# Patient Record
Sex: Female | Born: 1965 | Race: White | Hispanic: No | Marital: Single | Smoking: Never smoker
Health system: Southern US, Community
[De-identification: ages and names within clinical notes are randomized; demographics above are authoritative.]

## PROBLEM LIST (undated history)

## (undated) DIAGNOSIS — J45909 Unspecified asthma, uncomplicated: Secondary | ICD-10-CM

## (undated) DIAGNOSIS — F32A Depression, unspecified: Secondary | ICD-10-CM

## (undated) DIAGNOSIS — R42 Dizziness and giddiness: Secondary | ICD-10-CM

## (undated) DIAGNOSIS — F419 Anxiety disorder, unspecified: Secondary | ICD-10-CM

## (undated) DIAGNOSIS — K219 Gastro-esophageal reflux disease without esophagitis: Secondary | ICD-10-CM

## (undated) DIAGNOSIS — R0600 Dyspnea, unspecified: Secondary | ICD-10-CM

## (undated) DIAGNOSIS — R079 Chest pain, unspecified: Secondary | ICD-10-CM

## (undated) DIAGNOSIS — M1990 Unspecified osteoarthritis, unspecified site: Secondary | ICD-10-CM

## (undated) DIAGNOSIS — R600 Localized edema: Secondary | ICD-10-CM

## (undated) HISTORY — PX: NO PAST SURGERIES: SHX2092

---

## 2004-05-21 ENCOUNTER — Other Ambulatory Visit: Payer: Self-pay

## 2004-05-21 ENCOUNTER — Emergency Department: Payer: Self-pay | Admitting: Unknown Physician Specialty

## 2004-08-05 ENCOUNTER — Emergency Department: Payer: Self-pay | Admitting: Internal Medicine

## 2007-02-12 ENCOUNTER — Emergency Department: Payer: Self-pay | Admitting: Internal Medicine

## 2007-02-12 ENCOUNTER — Other Ambulatory Visit: Payer: Self-pay

## 2008-04-23 IMAGING — CR DG CHEST 2V
1 series · 2 of 2 positions shown · non-contrast
Comparison: none

REASON FOR EXAM: near syncope
COMMENTS:

PROCEDURE:     DXR - DXR CHEST PA (OR AP) AND LATERAL  - February 12, 2007  [DATE]
RESULT:     The lungs are mildly hyperinflated. There is no focal
infiltrate. The heart and pulmonary vascularity are within the limits of
normal. There is no pleural effusion. The bony structures are normal.

[Series 1: view not recorded · 0.17mm/px · 2 of 2 slices shown]
[im 1/2]
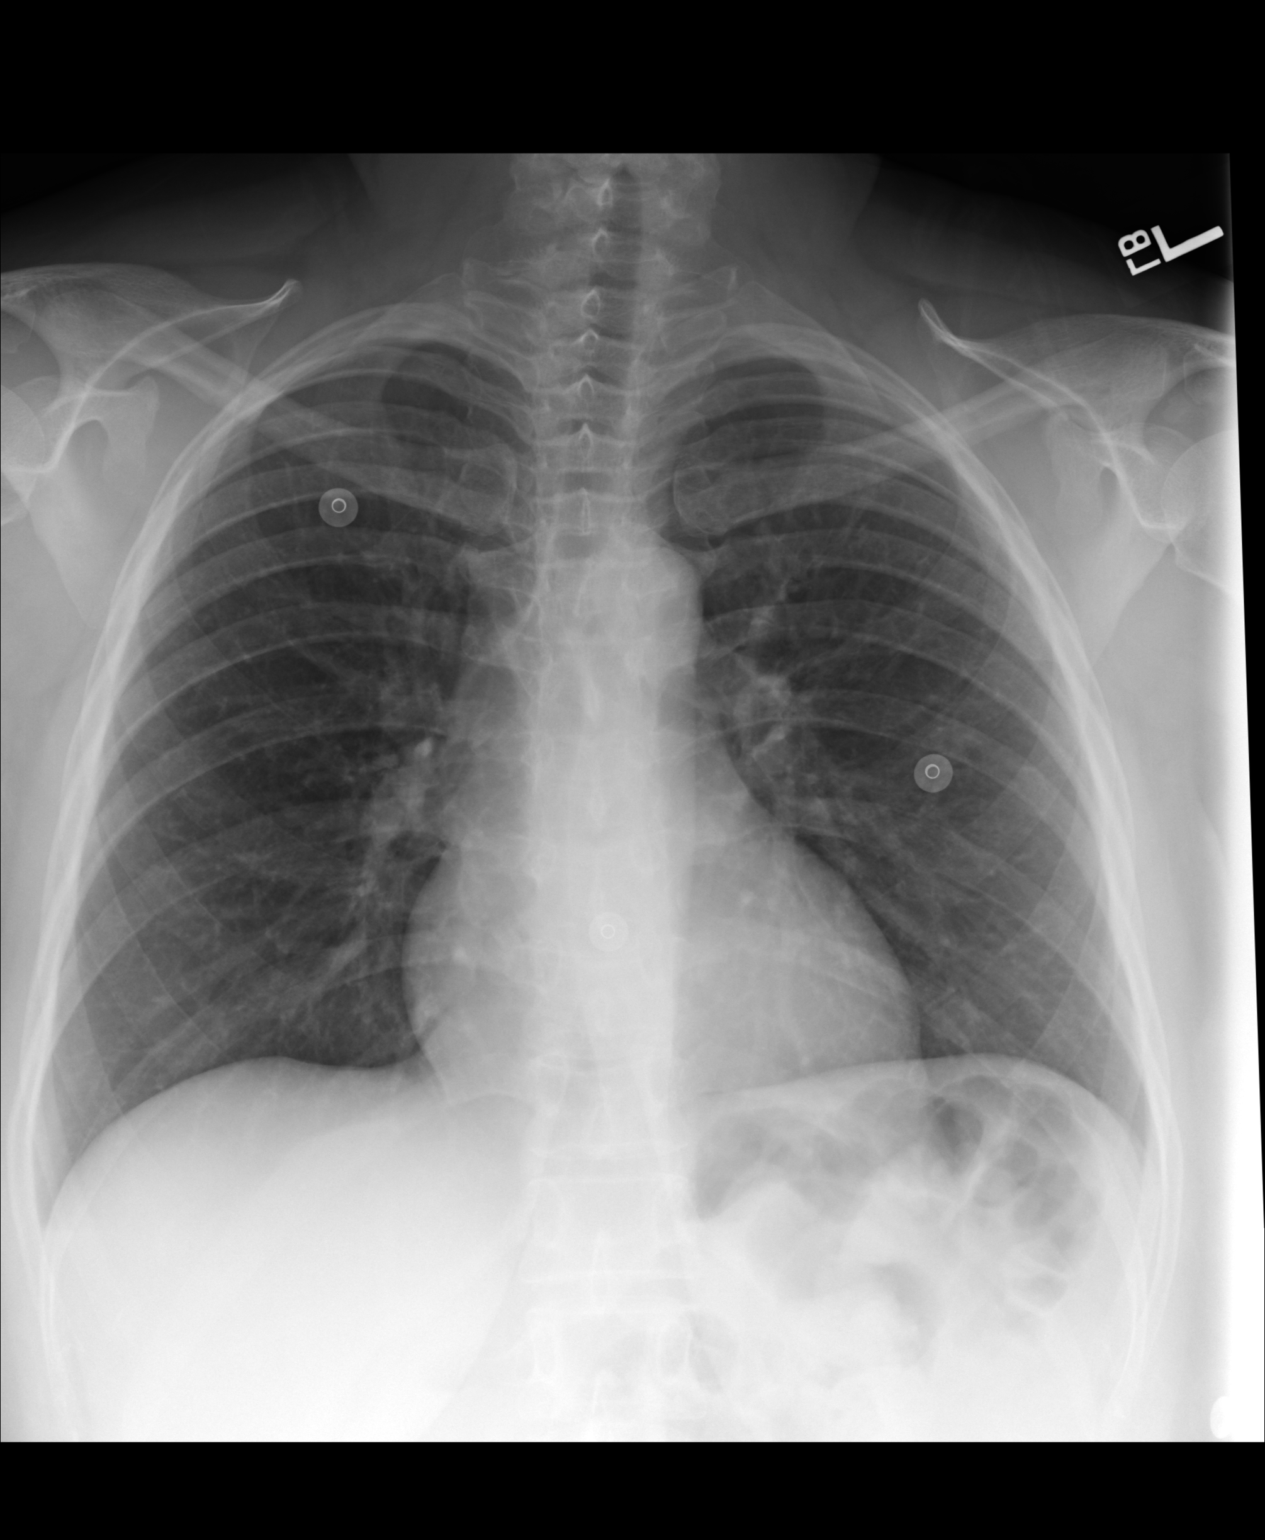
[im 2/2]
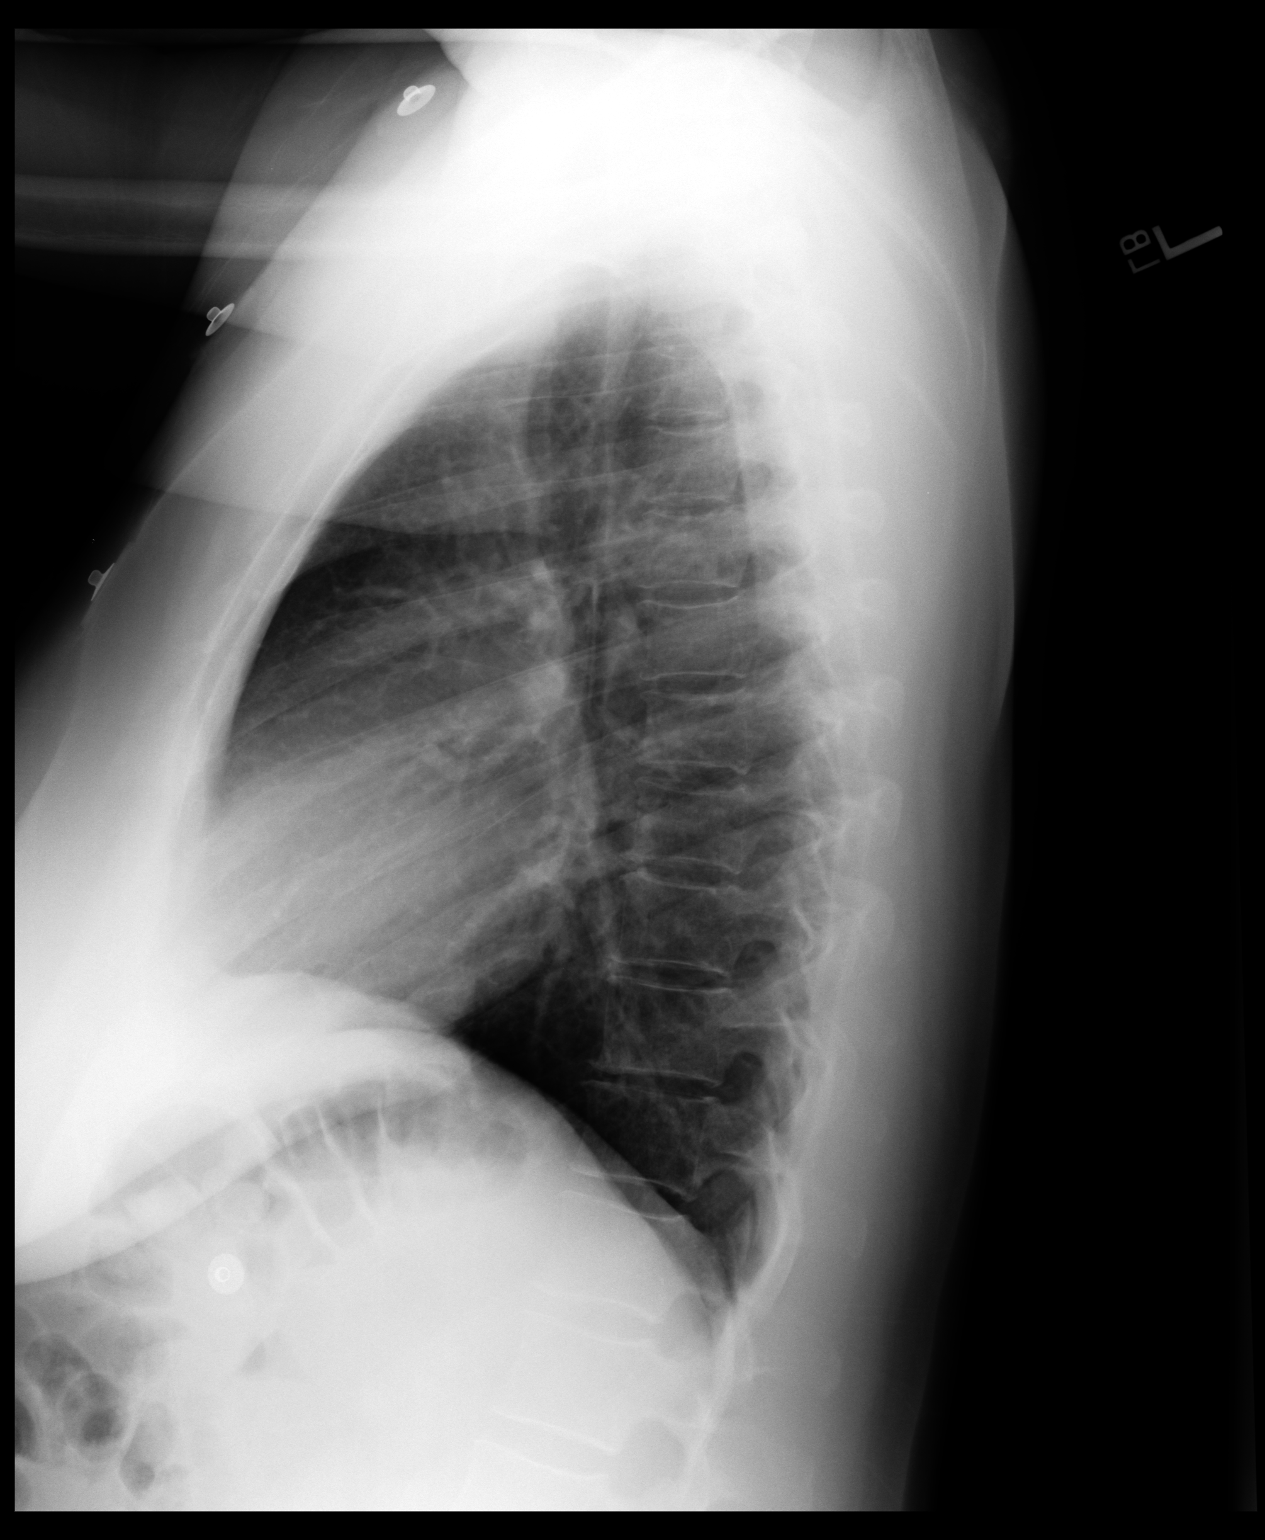

[2 of 2 positions shown; findings below may reference images not displayed]

IMPRESSION: I do not see evidence of acute cardiopulmonary abnormality.
Mild hyperinflation is likely voluntary but could reflect mild air trapping
in the appropriate clinical setting.

## 2009-08-22 ENCOUNTER — Emergency Department: Payer: Self-pay | Admitting: Internal Medicine

## 2009-10-06 ENCOUNTER — Emergency Department: Payer: Self-pay | Admitting: Emergency Medicine

## 2017-11-27 ENCOUNTER — Other Ambulatory Visit: Payer: Self-pay

## 2017-11-27 ENCOUNTER — Encounter: Payer: Self-pay | Admitting: Emergency Medicine

## 2017-11-27 ENCOUNTER — Ambulatory Visit
Admission: EM | Admit: 2017-11-27 | Discharge: 2017-11-27 | Disposition: A | Discharge status: Home / Self Care | Payer: BLUE CROSS/BLUE SHIELD | Attending: Internal Medicine | Admitting: Internal Medicine

## 2017-11-27 DIAGNOSIS — J019 Acute sinusitis, unspecified: Secondary | ICD-10-CM

## 2017-11-27 MED ORDER — AMOXICILLIN-POT CLAVULANATE 875-125 MG PO TABS: 1.0000 | ORAL_TABLET | Freq: Two times a day (BID) | ORAL | 0 refills | Status: DC

## 2017-11-27 MED ORDER — TRIAMCINOLONE ACETONIDE 55 MCG/ACT NA AERO: 2.0000 | INHALATION_SPRAY | Freq: Every day | NASAL | 0 refills | Status: DC

## 2017-11-27 NOTE — Discharge Instructions (Addendum)
Anticipate continued improvement in sinus congestion, imbalance, jitterniness, fatigue, over the next week or two.  Prescriptions for amoxicillin/clavulanate (antibiotic) and nasal steroid spray (for congestion) were sent to the pharmacy.  Review of your [facility] chart shows a prescription for amoxicillin was given in 06/2016, without ill effect, so I felt comfortable prescribing amoxicillin/clavulanate today.  Recheck for new fever >100.5, increasing phlegm production/nasal discharge, or if not starting to improve in a few days.   Note for work [employer].

## 2017-11-27 NOTE — ED Triage Notes (Signed)
Patient states that she was seen at [facility] walk[facility] on 11/20/17 and was given medication for this.  Patient states that her symptoms have not improved but since taking the medicine she feels off balance.

## 2017-11-27 NOTE — ED Provider Notes (Signed)
[facility]    CSN: [id number] Arrival date & time: 11/27/17  1346     History   Chief Complaint Chief Complaint  Patient presents with  . Sinus Problem    HPI Thereasa ParkinBetty L Carrillo is a [age] y.o. Carrillo.   She has a history of frequent sinusitis, sometimes prolonged episodes.  She presents today with a couple month history of headache, profuse nasal drainage, sinus congestion, productive cough.  She was seen at [facility] a week ago, received prescriptions for prednisone and doxycycline.  She has had a marked reduction in nasal discharge and productive cough, but still feels she has some head congestion, feels off balance, jittery, some fatigue.  Headache has resolved.  No fever.  Little bit of crampy abdominal discomfort last night, and one loose stool.  Would like a work note.    HPI  History reviewed. No pertinent past medical history.  History reviewed. No pertinent surgical history.   Home Medications    Prior to Admission medications   Medication Sig Start Date End Date Taking? Authorizing Provider  azelastine (ASTELIN) 0.1 % nasal spray Place into [facility] nose. 11/20/17  Yes [provider]  fluticasone (FLONASE) 50 MCG/ACT nasal spray Place into [facility] nose. 04/25/17  Yes [provider]  amoxicillin-clavulanate (AUGMENTIN) 875-125 MG tablet Take 1 tablet by mouth every 12 (twelve) hours. 11/27/17   [facility]andra Carrillo, Cassandra Mcbee Wilson, MD  triamcinolone (NASACORT) 55 MCG/ACT AERO nasal inhaler Place 2 sprays into [facility] nose daily. 11/27/17   [facility]andra Carrillo, Cassandra Coltrane Wilson, MD    Family History History reviewed. No pertinent family history.  Social History Social History   Tobacco Use  . Smoking status: Never Smoker  . Smokeless tobacco: Never Used  Substance Use Topics  . Alcohol use: Never    Frequency: Never  . Drug use: Never     Allergies   Penicillins Patient reports remote hx childhood rash after antibiotic injection (?penicillin) at dr's  office.  [facility] record review with rx amoxicillin given in 06/2016; patient reports this was tolerated without incident.  Review of Systems Review of Systems  All other systems reviewed and are negative.    Physical Exam Triage Vital Signs ED Triage Vitals  Enc Vitals Group     BP 11/27/17 1408 121/89     Pulse Rate 11/27/17 1408 72     Resp 11/27/17 1408 16     Temp 11/27/17 1408 98.5 F (36.9 C)     Temp Source 11/27/17 1408 Oral     SpO2 11/27/17 1408 97 %     Weight 11/27/17 1404 220 lb (99.8 kg)     Height 11/27/17 1404 5\' 4"  (1.626 m)     Pain Score 11/27/17 1403 2     Pain Loc --    Updated Vital Signs BP 121/89 (BP Location: Left Arm)   Pulse 72   Temp 98.5 F (36.9 C) (Oral)   Resp 16   Ht 5\' 4"  (1.626 m)   Wt 99.8 kg   LMP 11/25/2017 (Approximate)   SpO2 97%   BMI 37.76 kg/m  Physical Exam  Constitutional: She is oriented to person, place, and time. No distress.  HENT:  Head: Atraumatic.  Bilateral TMs are dull, right is red-tinged Significant, almost total, nasal congestion bilaterally Tonsils are prominent but pink, with scant postnasal discharge evident  Eyes:  Conjugate gaze observed, no eye redness/discharge  Neck: Neck supple.  Cardiovascular: Normal rate and regular rhythm.  Pulmonary/Chest: No respiratory  distress. She has no wheezes. She has no rales.  Lungs clear, symmetric breath sounds   Abdominal: She exhibits no distension.  Musculoskeletal: Normal range of motion.  Neurological: She is alert and oriented to person, place, and time.  [facility]ed into [facility] independently, was able to climb on/off [facility] exam table  Skin: Skin is warm and dry.  Nursing note and vitals reviewed.  EMR/care everywhere reviewed.   Final Clinical Impressions(s) / UC Diagnoses   Final diagnoses:  Acute sinusitis with symptoms > 10 days     Discharge Instructions     Anticipate continued improvement in sinus congestion, imbalance, jitterniness,  fatigue, over [facility] next week or two.  Prescriptions for amoxicillin/clavulanate (antibiotic) and nasal steroid spray (for congestion) were sent to [facility].  Review of your [facility] chart shows a prescription for amoxicillin was given in 06/2016, without ill effect, so I felt comfortable prescribing amoxicillin/clavulanate today.  Recheck for new fever >100.5, increasing phlegm production/nasal discharge, or if not starting to improve in a few days.   Note for work [employer].   ED Prescriptions    Medication Sig Dispense Auth. Provider   amoxicillin-clavulanate (AUGMENTIN) 875-125 MG tablet Take 1 tablet by mouth every 12 (twelve) hours. 14 tablet [facility] Carrillo, Westley Blass Wilson, MD   triamcinolone (NASACORT) 55 MCG/ACT AERO nasal inhaler Place 2 sprays into [facility] nose daily. 1 Inhaler [facility] Carrillo, Mardell Cragg Wilson, MD        [facility] Carrillo, Raeley Gilmore Wilson, MD 11/27/17 [phone]

## 2017-12-04 ENCOUNTER — Other Ambulatory Visit: Payer: Self-pay

## 2017-12-04 ENCOUNTER — Ambulatory Visit
Admission: EM | Admit: 2017-12-04 | Discharge: 2017-12-04 | Disposition: A | Discharge status: Home / Self Care | Payer: BLUE CROSS/BLUE SHIELD | Attending: Family Medicine | Admitting: Family Medicine

## 2017-12-04 DIAGNOSIS — J0101 Acute recurrent maxillary sinusitis: Secondary | ICD-10-CM | POA: Diagnosis not present

## 2017-12-04 DIAGNOSIS — H6983 Other specified disorders of Eustachian tube, bilateral: Secondary | ICD-10-CM | POA: Diagnosis not present

## 2017-12-04 MED ORDER — LORATADINE 10 MG PO TABS: 10.0000 mg | ORAL_TABLET | Freq: Every day | ORAL | 0 refills | Status: DC

## 2017-12-04 MED ORDER — AMOXICILLIN-POT CLAVULANATE 875-125 MG PO TABS: 1.0000 | ORAL_TABLET | Freq: Two times a day (BID) | ORAL | 0 refills | Status: AC

## 2017-12-04 NOTE — Discharge Instructions (Addendum)
Take medication as prescribed. Rest. Drink plenty of fluids.   Follow-up with ear nose and throat this week.  See the above to call to schedule appointment today.  Follow up with your primary care physician this week as needed. Return to Urgent care for new or worsening concerns.

## 2017-12-04 NOTE — ED Triage Notes (Signed)
Patient complains of sinus pain and pressure x 11/20/2017. Patient states that she has been on Augmentin and nasacort without relief. Patient states that she has not noticed any improvement.

## 2017-12-04 NOTE — ED Provider Notes (Signed)
[facility] ____________________________________________  Time seen: Approximately 5:00 PM  I have reviewed the triage vital signs and the nursing notes.   HISTORY  Chief Complaint Sinusitis  HPI Cassandra Carrillo is a 52 y.o. female presenting with husband at bedside for evaluation of continued nasal congestion, sinus drainage and fluid sensation in her ears.  States that overall this is been going on for 2 to 3 weeks.  Reports she was seen initially on 8/12 and prescribed doxycycline, prednisone and Tessalon.  States that she continue these medications but was seen last Monday for continued complaints and at which point she was put on a 7-day course of Augmentin.  States that she has 1 dose left of this medicine.  States that she currently feels much better than she did last Monday, but reports she still has nasal congestion.  States that she still gets some clear nasal drainage out, and thicker postnasal drainage.  States her biggest complaint is that both of her ears feel like there is fluid sensation and some pressure beneath her ears.  States minimal sinus pain continued, states that that she is much improved.  Denies sore throat, oral swelling or difficulty swallowing.  No accompanying fevers.  Does work with patients and frequently around sick people.  Has been using the Augmentin and Flonase.  States that she believes she has some seasonal allergies as she has intermittent nasal congestion since May.  No over-the-counter medications taken for the same complaints. Denies chest pain, shortness of breath, syncope, near-syncope, unsteady gait, fall, paresthesias, abdominal pain, dysuria, or rash.    History reviewed. No pertinent past medical history. Denies  There are no active problems to display for this patient.   History reviewed. No pertinent surgical history.   No current facility-administered medications for this encounter.   Current Outpatient Medications:  .   azelastine (ASTELIN) 0.1 % nasal spray, Place into the nose., Disp: , Rfl:  .  fluticasone (FLONASE) 50 MCG/ACT nasal spray, Place into the nose., Disp: , Rfl:  .  triamcinolone (NASACORT) 55 MCG/ACT AERO nasal inhaler, Place 2 sprays into the nose daily., Disp: 1 Inhaler, Rfl: 0 .  amoxicillin-clavulanate (AUGMENTIN) 875-125 MG tablet, Take 1 tablet by mouth every 12 (twelve) hours for 3 days., Disp: 6 tablet, Rfl: 0 .  loratadine (CLARITIN) 10 MG tablet, Take 1 tablet (10 mg total) by mouth daily., Disp: 30 tablet, Rfl: 0  Allergies Penicillins  Family History  Problem Relation Age of Onset  . Kidney disease Mother   . Heart disease Father     Social History Social History   Tobacco Use  . Smoking status: Never Smoker  . Smokeless tobacco: Never Used  Substance Use Topics  . Alcohol use: Never    Frequency: Never  . Drug use: Never    Review of Systems Constitutional: No fever/chills Eyes: No visual changes. ENT: No sore throat. As above.  Cardiovascular: Denies chest pain. Respiratory: Denies shortness of breath. Gastrointestinal: No abdominal pain.  No nausea, no vomiting.  No diarrhea.  No constipation. Genitourinary: Negative for dysuria. Musculoskeletal: Negative for back pain. Skin: Negative for rash. Neurological: Negative for headaches, focal weakness or numbness.   ____________________________________________   PHYSICAL EXAM:  VITAL SIGNS: ED Triage Vitals  Enc Vitals Group     BP 12/04/17 1557 118/77     Pulse Rate 12/04/17 1557 73     Resp 12/04/17 1557 18     Temp 12/04/17 1557 98.2 F (36.8 C)  Temp Source 12/04/17 1557 Oral     SpO2 12/04/17 1557 99 %     Weight 12/04/17 1555 220 lb (99.8 kg)     Height 12/04/17 1555 5\' 4"  (1.626 m)     Head Circumference --      Peak Flow --      Pain Score 12/04/17 1554 2     Pain Loc --      Pain Edu? --      Excl. in GC? --     Constitutional: Alert and oriented. Well appearing and in no acute  distress. Eyes: Conjunctivae are normal. PERRL. EOMI. Head: Atraumatic.Mild tenderness to palpation bilateral maxillary sinuses.  No frontal tenderness palpation.  No swelling. No erythema.   Ears: no erythema, normal canals, mild bilateral effusion, otherwise normal TMs bilaterally.  No mastoid tenderness bilaterally.  Nose: nasal congestion  Mouth/Throat: Mucous membranes are moist.  Oropharynx non-erythematous.No tonsillar swelling or exudate.  Neck: No stridor.  No cervical spine tenderness to palpation. Hematological/Lymphatic/Immunilogical: No cervical lymphadenopathy. Cardiovascular: Normal rate, regular rhythm. Grossly normal heart sounds.  Good peripheral circulation. Respiratory: Normal respiratory effort.  No retractions.No wheezes, rales or rhonchi. Good air movement.  Musculoskeletal:  No cervical, thoracic or lumbar tenderness to palpation.  Steady gait. Neurologic:  Normal speech and language. No gross focal neurologic deficits are appreciated. No gait instability.  No paresthesias.  No ataxia. Skin:  Skin is warm, dry and intact. No rash noted. Psychiatric: Mood and affect are normal. Speech and behavior are normal.   ___________________________________________   LABS (all labs ordered are listed, but only abnormal results are displayed)  Labs Reviewed - No data to display ____________________________________________   PROCEDURES Procedures   INITIAL IMPRESSION / ASSESSMENT AND PLAN / ED COURSE  Pertinent labs & imaging results that were available during my care of the patient were reviewed by me and considered in my medical decision making (see chart for details).  Well-appearing patient.  No acute distress.  Patient has been on 2 courses of antibiotics in the last 2 weeks, currently on a 7-day course of Augmentin.  Continues with nasal drainage and fluid sensation in her ears.  Steady gait.  No focal neurological deficit.  Patient states that she has been improving  with the Augmentin.  Suspect eustachian tube dysfunction.  Also possible seasonal allergies with recent secondary infection versus viral.  Will extend Augmentin for 3 more days, add oral Claritin and recommend for ENT follow-up towards the end of this week, information given and directed patient to call.  Discussed strict follow-up and sooner return parameters. Discussed indication, risks and benefits of medications with patient.  Discussed follow up with Primary care physician this week. Discussed follow up and return parameters including no resolution or any worsening concerns. Patient verbalized understanding and agreed to plan.   ____________________________________________   FINAL CLINICAL IMPRESSION(S) / ED DIAGNOSES  Final diagnoses:  Dysfunction of both eustachian tubes  Acute recurrent maxillary sinusitis     ED Discharge Orders         Ordered    amoxicillin-clavulanate (AUGMENTIN) 875-125 MG tablet  Every 12 hours     12/04/17 1657    loratadine (CLARITIN) 10 MG tablet  Daily     12/04/17 1657           Note: This dictation was prepared with Dragon dictation along with smaller phrase technology. Any transcriptional errors that result from this process are unintentional.         Hyacinth Meeker,  Mardella LaymanLindsey, NP 12/04/17 1710

## 2018-06-07 ENCOUNTER — Other Ambulatory Visit: Payer: Self-pay

## 2018-06-07 ENCOUNTER — Encounter: Payer: Self-pay | Admitting: Emergency Medicine

## 2018-06-07 ENCOUNTER — Ambulatory Visit
Admission: EM | Admit: 2018-06-07 | Discharge: 2018-06-07 | Disposition: A | Discharge status: Home / Self Care | Payer: BLUE CROSS/BLUE SHIELD | Attending: Family Medicine | Admitting: Family Medicine

## 2018-06-07 DIAGNOSIS — J320 Chronic maxillary sinusitis: Secondary | ICD-10-CM

## 2018-06-07 MED ORDER — DOXYCYCLINE HYCLATE 100 MG PO CAPS: 100.0000 mg | ORAL_CAPSULE | Freq: Two times a day (BID) | ORAL | 0 refills | Status: DC

## 2018-06-07 NOTE — Discharge Instructions (Addendum)
Continue taking Flonase on a daily basis for the next 2 to 3 weeks.  Consider using a Nettie pot daily before using Flonase.  Follow-up with Dr. Genevive Bi for evaluation of your recurrent chronic sinusitis

## 2018-06-07 NOTE — ED Triage Notes (Signed)
Pt c/o facial pain and swelling, nasal congestion, post nasal drainage, and cough. Started about 3-4 days ago. She states her head feels full.

## 2018-06-07 NOTE — ED Provider Notes (Signed)
[facility]    CSN: [id number] Arrival date & time: 06/07/18  1342     History   Chief Complaint Chief Complaint  Patient presents with  . Facial Pain    HPI [facility] Carrillo is a 53 y.o. female.   HPI  53 year old female presents with facial pain and swelling over the maxillary sinuses along with nasal congestion postnasal drainage and a cough that started about 3 to 4 days ago.  States that actually started several weeks ago with drainage that she has been having causing her to cough.  States that her head feels full and she has pain in her ears along the jawline inferiorly.  She has had no fever or chills.  Review of her medical records show that she has had recurrent sinusitis problems treated with antibiotics.  Despite this that she has recurrent symptoms she states it is been a while since she has been examined by an ear nose and throat specialist.  In August 2019 she had 3 visits for the same problem.  Prior to that in January 2019 and October 2018 she had been treated for acute sinusitis.       History reviewed. No pertinent past medical history.  There are no active problems to display for this patient.   Past Surgical History:  Procedure Laterality Date  . NO PAST SURGERIES      OB History   No obstetric history on file.      Home Medications    Prior to Admission medications   Medication Sig Start Date End Date Taking? Authorizing Provider  fluticasone (FLONASE) 50 MCG/ACT nasal spray Place into the nose. 04/25/17  Yes [provider]  loratadine (CLARITIN) 10 MG tablet Take 1 tablet (10 mg total) by mouth daily. 12/04/17  Yes Renford Dills, NP  doxycycline (VIBRAMYCIN) 100 MG capsule Take 1 capsule (100 mg total) by mouth 2 (two) times daily. 06/07/18   Lutricia Feil, PA-C    Family History Family History  Problem Relation Age of Onset  . Kidney disease Mother   . Heart disease Father     Social History Social History    Tobacco Use  . Smoking status: Never Smoker  . Smokeless tobacco: Never Used  Substance Use Topics  . Alcohol use: Never    Frequency: Never  . Drug use: Never     Allergies   Penicillins   Review of Systems Review of Systems  Constitutional: Positive for activity change and chills. Negative for appetite change, diaphoresis, fatigue and fever.  HENT: Positive for congestion, ear pain, facial swelling, postnasal drip, sinus pressure and sinus pain.   Respiratory: Positive for cough.   All other systems reviewed and are negative.    Physical Exam Triage Vital Signs ED Triage Vitals  Enc Vitals Group     BP 06/07/18 1412 126/63     Pulse Rate 06/07/18 1412 65     Resp 06/07/18 1412 18     Temp 06/07/18 1412 98.1 F (36.7 C)     Temp Source 06/07/18 1412 Oral     SpO2 06/07/18 1412 98 %     Weight 06/07/18 1408 220 lb (99.8 kg)     Height 06/07/18 1408  (1.626 m)     Head Circumference --      Peak Flow --      Pain Score 06/07/18 1408 5     Pain Loc --      Pain Edu? --  Excl. in GC? --    No data found.  Updated Vital Signs BP 126/63 (BP Location: Left Arm)   Pulse 65   Temp 98.1 F (36.7 C) (Oral)   Resp 18   Ht 5\' 4"  (1.626 m)   Wt 220 lb (99.8 kg)   LMP 04/06/2018   SpO2 98%   BMI 37.76 kg/m   Visual Acuity Right Eye Distance:   Left Eye Distance:   Bilateral Distance:    Right Eye Near:   Left Eye Near:    Bilateral Near:     Physical Exam Vitals signs and nursing note reviewed.  Constitutional:      General: She is not in acute distress.    Appearance: Normal appearance. She is obese. She is not ill-appearing, toxic-appearing or diaphoretic.  HENT:     Head: Normocephalic and atraumatic.     Comments: TM is dark and dull.  Tenderness to percussion over the sinuses more prevalent on the left than right.    Right Ear: Ear canal and external ear normal.     Left Ear: Ear canal and external ear normal.     Nose: Nose normal. No  rhinorrhea.     Mouth/Throat:     Mouth: Mucous membranes are moist.     Pharynx: Oropharynx is clear. No oropharyngeal exudate or posterior oropharyngeal erythema.  Eyes:     General:        Right eye: No discharge.        Left eye: No discharge.     Conjunctiva/sclera: Conjunctivae normal.  Neck:     Musculoskeletal: Normal range of motion and neck supple.  Pulmonary:     Effort: Pulmonary effort is normal.     Breath sounds: Normal breath sounds.  Musculoskeletal: Normal range of motion.  Lymphadenopathy:     Cervical: No cervical adenopathy.  Skin:    General: Skin is warm and dry.  Neurological:     General: No focal deficit present.     Mental Status: She is alert and oriented to person, place, and time.  Psychiatric:        Mood and Affect: Mood normal.        Behavior: Behavior normal.        Thought Content: Thought content normal.        Judgment: Judgment normal.      UC Treatments / Results  Labs (all labs ordered are listed, but only abnormal results are displayed) Labs Reviewed - No data to display  EKG None  Radiology No results found.  Procedures Procedures (including critical care time)  Medications Ordered in UC Medications - No data to display  Initial Impression / Assessment and Plan / UC Course  I have reviewed the triage vital signs and the nursing notes.  Pertinent labs & imaging results that were available during my care of the patient were reviewed by me and considered in my medical decision making (see chart for details).   Has recurrent sinusitis that should be evaluated further.  The meantime I will place her on doxycycline.  She will continue with her fluticasone.  So discussed using a Nettie pot with her and she seems amenable to it.  Amended Dr. Frederik Pear for her to follow-up.   Final Clinical Impressions(s) / UC Diagnoses   Final diagnoses:  Chronic maxillary sinusitis     Discharge Instructions     Continue taking  Flonase on a daily basis for the next 2 to 3 weeks.  Consider using a Nettie pot daily before using Flonase.  Follow-up with Dr. Genevive Bi for evaluation of your recurrent chronic sinusitis   ED Prescriptions    Medication Sig Dispense Auth. Provider   doxycycline (VIBRAMYCIN) 100 MG capsule Take 1 capsule (100 mg total) by mouth 2 (two) times daily. 14 capsule Lutricia Feil, PA-C     Controlled Substance Prescriptions [place] Controlled Substance Registry consulted? Not Applicable   Lutricia Feil, PA-C 06/07/18 2134

## 2018-06-11 ENCOUNTER — Other Ambulatory Visit: Payer: Self-pay

## 2018-06-11 ENCOUNTER — Encounter: Payer: Self-pay | Admitting: Emergency Medicine

## 2018-06-11 ENCOUNTER — Ambulatory Visit
Admission: EM | Admit: 2018-06-11 | Discharge: 2018-06-11 | Disposition: A | Discharge status: Home / Self Care | Payer: BLUE CROSS/BLUE SHIELD | Attending: Internal Medicine | Admitting: Internal Medicine

## 2018-06-11 DIAGNOSIS — H6982 Other specified disorders of Eustachian tube, left ear: Secondary | ICD-10-CM

## 2018-06-11 DIAGNOSIS — H938X2 Other specified disorders of left ear: Secondary | ICD-10-CM | POA: Diagnosis not present

## 2018-06-11 MED ORDER — GUAIFENESIN ER 600 MG PO TB12: 600.0000 mg | ORAL_TABLET | Freq: Two times a day (BID) | ORAL | Status: AC

## 2018-06-11 NOTE — ED Triage Notes (Signed)
Patient in today with continued left jaw pain and swelling. Patient seen 06/07/18 for same.

## 2018-06-11 NOTE — ED Provider Notes (Signed)
[facility]    CSN: [id number] Arrival date & time: 06/11/18  1030     History   Chief Complaint Chief Complaint  Patient presents with  . Jaw Pain    HPI Cassandra Carrillo is a 53 y.o. female with a history of seasonal allergies and recurrent sinusitis comes back to the urgent care for left ear fullness.  Patient was seen here last week and is currently being treated for sinusitis with doxycycline, fluticasone and Claritin.  Patient is not taking the Claritin regularly.  She complains of fullness in the left ear which is constant.  Denies any buzzing in the ears.  No aggravating or relieving factors.  Symptoms are moderately severe.  She hears a lot of "popping" in the left ear.  No fever or chills.  She is tolerating medications at this time.  HPI  History reviewed. No pertinent past medical history.  There are no active problems to display for this patient.   Past Surgical History:  Procedure Laterality Date  . NO PAST SURGERIES      OB History   No obstetric history on file.      Home Medications    Prior to Admission medications   Medication Sig Start Date End Date Taking? Authorizing Provider  doxycycline (VIBRAMYCIN) 100 MG capsule Take 1 capsule (100 mg total) by mouth 2 (two) times daily. 06/07/18  Yes Lutricia Feil, PA-C  fluticasone (FLONASE) 50 MCG/ACT nasal spray Place into the nose. 04/25/17  Yes [provider]  loratadine (CLARITIN) 10 MG tablet Take 1 tablet (10 mg total) by mouth daily. 12/04/17  Yes Renford Dills, NP  guaiFENesin (MUCINEX) 600 MG 12 hr tablet Take 1 tablet (600 mg total) by mouth 2 (two) times daily for 7 days. 06/11/18 06/18/18  Merrilee Jansky, MD    Family History Family History  Problem Relation Age of Onset  . Kidney disease Mother   . Heart disease Father     Social History Social History   Tobacco Use  . Smoking status: Never Smoker  . Smokeless tobacco: Never Used  Substance Use Topics  .  Alcohol use: Never    Frequency: Never  . Drug use: Never     Allergies   Penicillins   Review of Systems Review of Systems  Constitutional: Negative for activity change, appetite change, chills and fever.  HENT: Positive for congestion and postnasal drip. Negative for ear discharge, ear pain, hearing loss, rhinorrhea, sinus pressure, sinus pain, sore throat, tinnitus and voice change.   Eyes: Positive for itching. Negative for pain and discharge.  Respiratory: Negative for apnea, cough, choking and shortness of breath.   Gastrointestinal: Negative for abdominal distention and abdominal pain.  Musculoskeletal: Negative for arthralgias and back pain.  Skin: Negative for rash and wound.  Allergic/Immunologic: Positive for environmental allergies.  Neurological: Negative for dizziness, weakness, light-headedness and headaches.  Psychiatric/Behavioral: Negative for agitation, confusion and decreased concentration.     Physical Exam Triage Vital Signs ED Triage Vitals  Enc Vitals Group     BP 06/11/18 1058 133/87     Pulse Rate 06/11/18 1058 71     Resp 06/11/18 1058 16     Temp 06/11/18 1058 97.9 F (36.6 C)     Temp Source 06/11/18 1058 Oral     SpO2 06/11/18 1058 98 %     Weight 06/11/18 1057 220 lb (99.8 kg)     Height 06/11/18 1057 5\' 4"  (1.626 m)  Head Circumference --      Peak Flow --      Pain Score 06/11/18 1057 3     Pain Loc --      Pain Edu? --      Excl. in GC? --    No data found.  Updated Vital Signs BP 133/87 (BP Location: Left Arm)   Pulse 71   Temp 97.9 F (36.6 C) (Oral)   Resp 16   Ht 5\' 4"  (1.626 m)   Wt 99.8 kg   SpO2 98%   BMI 37.76 kg/m   Visual Acuity Right Eye Distance:   Left Eye Distance:   Bilateral Distance:    Right Eye Near:   Left Eye Near:    Bilateral Near:     Physical Exam Constitutional:      Appearance: Normal appearance.  HENT:     Right Ear: Tympanic membrane normal. There is no impacted cerumen.     Left  Ear: Tympanic membrane normal. There is no impacted cerumen.     Nose: Nose normal.     Mouth/Throat:     Mouth: Mucous membranes are moist.     Pharynx: No oropharyngeal exudate or posterior oropharyngeal erythema.  Eyes:     General: No scleral icterus.    Conjunctiva/sclera: Conjunctivae normal.  Neck:     Musculoskeletal: No neck rigidity.  Cardiovascular:     Rate and Rhythm: Normal rate and regular rhythm.     Pulses: Normal pulses.     Heart sounds: Normal heart sounds.  Pulmonary:     Effort: Pulmonary effort is normal.     Breath sounds: Normal breath sounds.  Abdominal:     General: Bowel sounds are normal.     Palpations: Abdomen is soft.  Musculoskeletal: Normal range of motion.  Lymphadenopathy:     Cervical: No cervical adenopathy.  Skin:    General: Skin is warm.     Capillary Refill: Capillary refill takes less than 2 seconds.     Coloration: Skin is not jaundiced.     Findings: No bruising.  Neurological:     Mental Status: She is alert.      UC Treatments / Results  Labs (all labs ordered are listed, but only abnormal results are displayed) Labs Reviewed - No data to display  EKG None  Radiology No results found.  Procedures Procedures (including critical care time)  Medications Ordered in UC Medications - No data to display  Initial Impression / Assessment and Plan / UC Course  I have reviewed the triage vital signs and the nursing notes.  Pertinent labs & imaging results that were available during my care of the patient were reviewed by me and considered in my medical decision making (see chart for details).     1.  Eustachian tube dysfunction: Mucinex Continue Claritin  2.  Recurrent sinusitis currently being treated: Continue fluticasone Complete doxycycline course  Final Clinical Impressions(s) / UC Diagnoses   Final diagnoses:  Ear fullness, left  Dysfunction of left eustachian tube   Discharge Instructions   None     ED Prescriptions    Medication Sig Dispense Auth. Provider   guaiFENesin (MUCINEX) 600 MG 12 hr tablet Take 1 tablet (600 mg total) by mouth 2 (two) times daily for 7 days.  Merrilee Jansky, MD     Controlled Substance Prescriptions [place] Controlled Substance Registry consulted? No   Merrilee Jansky, MD 06/11/18 [phone]

## 2018-06-18 ENCOUNTER — Encounter: Payer: Self-pay | Admitting: Emergency Medicine

## 2018-06-18 ENCOUNTER — Ambulatory Visit
Admission: EM | Admit: 2018-06-18 | Discharge: 2018-06-18 | Disposition: A | Discharge status: Home / Self Care | Payer: BLUE CROSS/BLUE SHIELD | Attending: Family Medicine | Admitting: Family Medicine

## 2018-06-18 ENCOUNTER — Other Ambulatory Visit: Payer: Self-pay

## 2018-06-18 DIAGNOSIS — R059 Cough, unspecified: Secondary | ICD-10-CM

## 2018-06-18 DIAGNOSIS — R05 Cough: Secondary | ICD-10-CM

## 2018-06-18 DIAGNOSIS — H6592 Unspecified nonsuppurative otitis media, left ear: Secondary | ICD-10-CM

## 2018-06-18 DIAGNOSIS — J0101 Acute recurrent maxillary sinusitis: Secondary | ICD-10-CM | POA: Diagnosis not present

## 2018-06-18 MED ORDER — HYDROCOD POLST-CPM POLST ER 10-8 MG/5ML PO SUER: 5.0000 mL | Freq: Every evening | ORAL | 0 refills | Status: DC | PRN

## 2018-06-18 MED ORDER — AMOXICILLIN-POT CLAVULANATE 875-125 MG PO TABS: 1.0000 | ORAL_TABLET | Freq: Two times a day (BID) | ORAL | 0 refills | Status: DC

## 2018-06-18 NOTE — Discharge Instructions (Addendum)
Take medication as prescribed. Rest. Drink plenty of fluids.  Continue the Mucinex and Flonase.  Follow-up with ear nose and throat as planned.  Follow up with your primary care physician this week as needed. Return to Urgent care for new or worsening concerns.

## 2018-06-18 NOTE — ED Triage Notes (Signed)
Patient c/o cough, wheezing and ear fullness that started 3 weeks ago. Patient was given Doxycycline at the 1st visit on 02/27 and she stated this hasn't helped. Patient then was seen on 03/02 for the same symptoms and given Guafenisin. She stated this didn't help her symptoms, She is requesting Augmentin 875, Prednisone and a nasal spray.

## 2018-06-18 NOTE — ED Provider Notes (Addendum)
[facility] ____________________________________________  Time seen: Approximately 5:18 PM  I have reviewed the triage vital signs and the nursing notes.   HISTORY  Chief Complaint Cough and Ear Fullness   HPI Cassandra Carrillo is a 53 y.o. female past medical history of seasonal allergies and recurrent sinusitis, presenting for evaluation of cough, congestion, nasal drainage, sinus pressure and ear fullness present for the last several weeks.  Patient reports that she was initially seen in urgent care on 2/27, and then on 3/2.  States that she was treated for sinusitis with oral doxycycline and then was given Mucinex.  States that she has been taken the Mucinex as well as completed the doxycycline without resolution.  States medicines do help, but reports she is continued with the congestion and thick green nasal discharge.  States intermittent cough, cough worse at night with postnasal drainage.  States last few days she has been having increased left ear discomfort and pressure sensation.  Denies known fevers.  Does have Flonase at home and has been intermittently using.  Continues to overall eat and drink well.  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  Denies chest pain, shortness of breath or abdominal pain.      History reviewed. No pertinent past medical history. Current sinusitis. There are no active problems to display for this patient.   Past Surgical History:  Procedure Laterality Date  . NO PAST SURGERIES       No current facility-administered medications for this encounter.   Current Outpatient Medications:  .  fluticasone (FLONASE) 50 MCG/ACT nasal spray, Place into the nose., Disp: , Rfl:  .  loratadine (CLARITIN) 10 MG tablet, Take 1 tablet (10 mg total) by mouth daily., Disp: 30 tablet, Rfl: 0 .  amoxicillin-clavulanate (AUGMENTIN) 875-125 MG tablet, Take 1 tablet by mouth every 12 (twelve) hours., Disp: 20  tablet, Rfl: 0 .  chlorpheniramine-HYDROcodone (TUSSIONEX PENNKINETIC ER) 10-8 MG/5ML SUER, Take 5 mLs by mouth at bedtime as needed for cough. do not drive or operate machinery while taking as can cause drowsiness., Disp: 50 mL, Rfl: 0 .  guaiFENesin (MUCINEX) 600 MG 12 hr tablet, Take 1 tablet (600 mg total) by mouth 2 (two) times daily for 7 days., Disp: , Rfl:   Allergies Penicillins  Family History  Problem Relation Age of Onset  . Kidney disease Mother   . Heart disease Father     Social History Social History   Tobacco Use  . Smoking status: Never Smoker  . Smokeless tobacco: Never Used  Substance Use Topics  . Alcohol use: Never    Frequency: Never  . Drug use: Never    Review of Systems Constitutional: No fever ENT: Some throat irritation.  Positive nasal congestion. Cardiovascular: Denies chest pain. Respiratory: Denies shortness of breath. Gastrointestinal: No abdominal pain.   Musculoskeletal: Negative for back pain. Skin: Negative for rash.   ____________________________________________   PHYSICAL EXAM:  VITAL SIGNS: ED Triage Vitals  Enc Vitals Group     BP 06/18/18 1617 129/82     Pulse Rate 06/18/18 1617 73     Resp 06/18/18 1617 18     Temp 06/18/18 1617 98.3 F (36.8 C)     Temp Source 06/18/18 1617 Oral     SpO2 06/18/18 1617 98 %     Weight 06/18/18 1615 220 lb (99.8 kg)     Height 06/18/18 1615 5\' 4"  (1.626 m)     Head  Circumference --      Peak Flow --      Pain Score 06/18/18 1615 0     Pain Loc --      Pain Edu? --      Excl. in GC? --     Constitutional: Alert and oriented. Well appearing and in no acute distress. Eyes: Conjunctivae are normal.  Head: Atraumatic.Mild tenderness to palpation bilateral frontal and moderate tenderness to palpation bilateral maxillary sinuses. No swelling. No erythema.   Ears: Left: Nontender, normal canal, moderate erythema with effusion present.  Right: Nontender, normal canal, no erythema, normal  TM.  Nose: nasal congestion with bilateral nasal turbinate erythema and edema.   Mouth/Throat: Mucous membranes are moist.  Oropharynx non-erythematous.No tonsillar swelling or exudate.  Neck: No stridor.  No cervical spine tenderness to palpation. Hematological/Lymphatic/Immunilogical: No cervical lymphadenopathy. Cardiovascular: Normal rate, regular rhythm. Grossly normal heart sounds.  Good peripheral circulation. Respiratory: Normal respiratory effort.  No retractions. No wheezes, rales or rhonchi. Good air movement.  Musculoskeletal: Steady gait. Neurologic:  Normal speech and language. No gait instability. Skin:  Skin is warm, dry and intact. No rash noted. Psychiatric: Mood and affect are normal. Speech and behavior are normal. _______________________________________   LABS (all labs ordered are listed, but only abnormal results are displayed)  Labs Reviewed - No data to display  PROCEDURES Procedures    INITIAL IMPRESSION / ASSESSMENT AND PLAN / ED COURSE  Pertinent labs & imaging results that were available during my care of the patient were reviewed by me and considered in my medical decision making (see chart for details).  Well-appearing patient.  No acute distress.  Suspect recent viral illness versus allergic rhinitis with secondary sinusitis.  Suspect failed doxycycline.  Patient states that she tolerates Augmentin well and not allergic.  Will treat with oral Augmentin continue Mucinex and Flonase.  Left otitis with effusion, continue supportive care as well as the Augmentin.  Recommend ENT follow-up, patient states that she has an appointment with ENT at the end of March.  Work note given.  PRN Tussionex at night.Discussed indication, risks and benefits of medications with patient.  Discussed follow up with Primary care physician this week. Discussed follow up and return parameters including no resolution or any worsening concerns. Patient verbalized understanding and  agreed to plan.   ____________________________________________   FINAL CLINICAL IMPRESSION(S) / ED DIAGNOSES  Final diagnoses:  Acute recurrent maxillary sinusitis  Left otitis media with effusion  Cough     ED Discharge Orders         Ordered    amoxicillin-clavulanate (AUGMENTIN) 875-125 MG tablet  Every 12 hours     06/18/18 1717    chlorpheniramine-HYDROcodone (TUSSIONEX PENNKINETIC ER) 10-8 MG/5ML SUER  At bedtime PRN     06/18/18 1717           Note: This dictation was prepared with Dragon dictation along with smaller phrase technology. Any transcriptional errors that result from this process are unintentional.         Renford Dills, NP 06/18/18 1958

## 2018-12-13 ENCOUNTER — Ambulatory Visit
Admission: EM | Admit: 2018-12-13 | Discharge: 2018-12-13 | Disposition: A | Discharge status: Home / Self Care | Payer: BLUE CROSS/BLUE SHIELD | Attending: Family Medicine | Admitting: Family Medicine

## 2018-12-13 ENCOUNTER — Other Ambulatory Visit: Payer: Self-pay

## 2018-12-13 DIAGNOSIS — K120 Recurrent oral aphthae: Secondary | ICD-10-CM

## 2018-12-13 MED ORDER — SULFURIC ACID-SULF PHENOLICS 30-50 % MT SOLN: OROMUCOSAL | 3 refills | Status: DC

## 2018-12-13 MED ORDER — LIDOCAINE VISCOUS HCL 2 % MT SOLN: OROMUCOSAL | 0 refills | Status: DC

## 2018-12-13 NOTE — ED Triage Notes (Signed)
Patient complains of canker sores on her left side of her tongue that started Monday PM.

## 2018-12-13 NOTE — ED Provider Notes (Signed)
[facility]    CSN: [id number] Arrival date & time: 12/13/18  1441  History   Chief Complaint Chief Complaint  Patient presents with  . Mouth Lesions   HPI  53 year old female presents with a lesion on her tongue.  Patient reports that she has developed a canker sore on the left side of her tongue which started on Monday.  She reports it is quite painful.  She rates her pain is 10/10 in severity.  Worse with eating and drinking.  She has tried salt water gargles without resolution.  No relieving factors.  She is unsure of the inciting factor.  No reported fever.  No other associated symptoms.  No other complaints.  PMH, Surgical Hx, Family Hx, Social History reviewed and updated as below.  PMH: Chronic sinusitis  Past Surgical History:  Procedure Laterality Date  . NO PAST SURGERIES      OB History   No obstetric history on file.      Home Medications    Prior to Admission medications   Medication Sig Start Date End Date Taking? Authorizing Provider  fluticasone (FLONASE) 50 MCG/ACT nasal spray Place into the nose. 04/25/17  Yes [provider]  loratadine (CLARITIN) 10 MG tablet Take 1 tablet (10 mg total) by mouth daily. 12/04/17  Yes Marylene Land, NP  lidocaine (XYLOCAINE) 2 % solution Apply small amount directly to canker sore every 3 hours as needed. 12/13/18   Coral Spikes, DO  Sulfuric Acid-Sulf Phenolics 32-67 % SOLN Apply to affected area per package instructions. Please provide 2 packages/treatments 12/13/18   Coral Spikes, DO    Family History Family History  Problem Relation Age of Onset  . Kidney disease Mother   . Heart disease Father     Social History Social History   Tobacco Use  . Smoking status: Never Smoker  . Smokeless tobacco: Never Used  Substance Use Topics  . Alcohol use: Never    Frequency: Never  . Drug use: Never     Allergies   Penicillins   Review of Systems Review of Systems  Constitutional:  Negative.   HENT:       Canker sore.   Physical Exam Triage Vital Signs ED Triage Vitals  Enc Vitals Group     BP 12/13/18 1458 125/65     Pulse Rate 12/13/18 1458 80     Resp 12/13/18 1458 18     Temp 12/13/18 1458 98.5 F (36.9 C)     Temp Source 12/13/18 1458 Oral     SpO2 12/13/18 1458 98 %     Weight 12/13/18 1456 217 lb (98.4 kg)     Height 12/13/18 1456 5\' 4"  (1.626 m)     Head Circumference --      Peak Flow --      Pain Score 12/13/18 1456 10     Pain Loc --      Pain Edu? --      Excl. in [place]? --    Updated Vital Signs BP 125/65 (BP Location: Left Arm)   Pulse 80   Temp 98.5 F (36.9 C) (Oral)   Resp 18   Ht 5\' 4"  (1.626 m)   Wt 98.4 kg   SpO2 98%   BMI 37.25 kg/m   Visual Acuity Right Eye Distance:   Left Eye Distance:   Bilateral Distance:    Right Eye Near:   Left Eye Near:    Bilateral Near:  Physical Exam Vitals signs and nursing note reviewed.  Constitutional:      General: She is not in acute distress.    Appearance: Normal appearance. She is obese.  HENT:     Head: Normocephalic and atraumatic.     Mouth/Throat:     Mouth: Mucous membranes are moist.     Pharynx: Oropharynx is clear.     Comments: Canker sore noted on the left side of the tongue.  No other lesions noted. Eyes:     General:        Right eye: No discharge.        Left eye: No discharge.     Conjunctiva/sclera: Conjunctivae normal.  Pulmonary:     Effort: Pulmonary effort is normal. No respiratory distress.  Skin:    General: Skin is warm.     Findings: No rash.  Neurological:     Mental Status: She is alert.  Psychiatric:        Mood and Affect: Mood normal.        Behavior: Behavior normal.    UC Treatments / Results  Labs (all labs ordered are listed, but only abnormal results are displayed) Labs Reviewed - No data to display  EKG   Radiology No results found.  Procedures Procedures (including critical care time)  Medications Ordered in UC  Medications - No data to display  Initial Impression / Assessment and Plan / UC Course  I have reviewed the triage vital signs and the nursing notes.  Pertinent labs & imaging results that were available during my care of the patient were reviewed by me and considered in my medical decision making (see chart for details).    53 year old female presents with a canker sore.  Viscous lidocaine as needed.  Also treating with Debacterol.   Final Clinical Impressions(s) / UC Diagnoses   Final diagnoses:  Canker sore   Discharge Instructions   None    ED Prescriptions    Medication Sig Dispense Auth. Provider   lidocaine (XYLOCAINE) 2 % solution Apply small amount directly to canker sore every 3 hours as needed. 100 mL Onesty Clair G, DO   Sulfuric Acid-Sulf Phenolics 30-50 % SOLN Apply to affected area per package instructions. Please provide 2 packages/treatments 3 mL Tommie Samsook, Jaan Fischel G, DO     Controlled Substance Prescriptions [place] Controlled Substance Registry consulted? Not Applicable   Tommie SamsCook, Tevyn Codd G, DO 12/13/18 1519

## 2019-01-28 ENCOUNTER — Other Ambulatory Visit: Payer: Self-pay

## 2019-01-28 ENCOUNTER — Ambulatory Visit
Admission: EM | Admit: 2019-01-28 | Discharge: 2019-01-28 | Disposition: A | Discharge status: Home / Self Care | Payer: BLUE CROSS/BLUE SHIELD | Attending: Urgent Care | Admitting: Urgent Care

## 2019-01-28 DIAGNOSIS — H66002 Acute suppurative otitis media without spontaneous rupture of ear drum, left ear: Secondary | ICD-10-CM | POA: Diagnosis not present

## 2019-01-28 DIAGNOSIS — J329 Chronic sinusitis, unspecified: Secondary | ICD-10-CM | POA: Diagnosis not present

## 2019-01-28 MED ORDER — AMOXICILLIN-POT CLAVULANATE 875-125 MG PO TABS: 1.0000 | ORAL_TABLET | Freq: Two times a day (BID) | ORAL | 0 refills | Status: AC

## 2019-01-28 NOTE — Discharge Instructions (Signed)
It was very nice seeing you today in clinic. Thank you for entrusting me with your care.  ° °Rest and increase fluid intake. Please utilize the medications that we discussed. Your prescriptions has been called in to your pharmacy.  ° °Make arrangements to follow up with your regular doctor in 1 week for re-evaluation if not improving. If your symptoms/condition worsens, please seek follow up care either here or in the ER. Please remember, our [place] providers are "right here with you" when you need us.  ° °Again, it was my pleasure to take care of you today. Thank you for choosing our clinic. I hope that you start to feel better quickly.  ° °Arlie Posch, MSN, APRN, FNP-C, CEN °Advanced Practice Provider °[facility] °

## 2019-01-28 NOTE — ED Triage Notes (Signed)
Patient states that she has been noticing facial swelling since Friday. Patient states that she has been having sinus issues off and on recently. Patient states that she has been diagnosed with chronic sinusitis. States that she typically has swelling when this occurs.

## 2019-01-28 NOTE — ED Provider Notes (Signed)
[place], [place]   Name: Cassandra Carrillo DOB: [birth year] MRN: [id number] CSN: [id number] PCP: Patient, No Pcp Per  Arrival date and time:  01/28/19 1007  Chief Complaint:  Facial Swelling (left)   NOTE: Prior to seeing the patient today, I have reviewed the triage nursing documentation and vital signs. Clinical staff has updated patient's PMH/PSHx, current medication list, and drug allergies/intolerances to ensure comprehensive history available to assist in medical decision making.   History:   HPI: Cassandra Carrillo is a 53 y.o. female who presents today with complaints of pain in her LEFT ear and the LEFT side of her face that began on Friday. Patient chronically on fluticasone and loratadine for allergies. Patient reports that she has appreciated swelling to the LEFT side of her face, which has historically been associated with her having a sinus infection. She notes that symptoms are making her dizzy. She denies otorrhea or changes to her hearing. She presents today with an unsteady gait pattern, which she reports is new for her. Patient denies fevers. She has not experienced any cough or sore throat. Patient has has intermittent generalized headaches. She denies that she has experienced any nausea, vomiting, diarrhea, or abdominal pain. She is eating and drinking well. Patient denies any perceived alterations to her sense of taste or smell. Patient denies being in close contact with anyone known to be ill. She has never been tested for SARS-CoV-2 (novel coronavirus) per her report.   History reviewed. No pertinent past medical history.  Past Surgical History:  Procedure Laterality Date  . NO PAST SURGERIES      Family History  Problem Relation Age of Onset  . Kidney disease Mother   . Heart disease Father     Social History   Tobacco Use  . Smoking status: Never Smoker  . Smokeless tobacco: Never Used  Substance Use Topics  . Alcohol use: Never    Frequency: Never  . Drug  use: Never    There are no active problems to display for this patient.   Home Medications:    Current Meds  Medication Sig  . fluticasone (FLONASE) 50 MCG/ACT nasal spray Place into the nose.  . loratadine (CLARITIN) 10 MG tablet Take 1 tablet (10 mg total) by mouth daily.    Allergies:   Penicillins  Review of Systems (ROS): Review of Systems  Constitutional: Negative for fatigue and fever.  HENT: Positive for ear pain, sinus pressure and sinus pain. Negative for congestion, postnasal drip, rhinorrhea, sneezing and sore throat.   Eyes: Negative for pain, discharge and redness.  Respiratory: Negative for cough, chest tightness and shortness of breath.   Cardiovascular: Negative for chest pain and palpitations.  Gastrointestinal: Negative for abdominal pain, diarrhea, nausea and vomiting.  Musculoskeletal: Negative for arthralgias, back pain, myalgias and neck pain.  Skin: Negative for color change, pallor and rash.  Allergic/Immunologic: Positive for environmental allergies (seasonal).  Neurological: Negative for dizziness, syncope, weakness and headaches.  Hematological: Negative for adenopathy.     Vital Signs: Today's Vitals   01/28/19 1023 01/28/19 1025  BP:  (!) 123/92  Pulse:  68  Resp:  15  Temp:  98.2 F (36.8 C)  TempSrc:  Oral  SpO2:  99%  Weight: 220 lb (99.8 kg)   Height: 5\' 4"  (1.626 m)   PainSc: 7      Physical Exam: Physical Exam  Constitutional: She is oriented to person, place, and time and well-developed, well-nourished, and in no distress. Vital  signs are normal. No distress.  HENT:  Head: Normocephalic and atraumatic.  Right Ear: A middle ear effusion (mild serous) is present.  Left Ear: There is tenderness. Tympanic membrane is erythematous. A middle ear effusion is present.  Nose: Mucosal edema and rhinorrhea present. Left sinus exhibits maxillary sinus tenderness.  Mouth/Throat: Uvula is midline and mucous membranes are normal. Posterior  oropharyngeal erythema present. No posterior oropharyngeal edema.  Eyes: Pupils are equal, round, and reactive to light. Conjunctivae and EOM are normal.  Neck: Normal range of motion. Neck supple.  Cardiovascular: Normal rate, regular rhythm, normal heart sounds and intact distal pulses. Exam reveals no gallop and no friction rub.  No murmur heard. Pulmonary/Chest: Effort normal. No respiratory distress. She has no decreased breath sounds. She has no wheezes. She has no rhonchi. She has no rales.  Abdominal: Soft. Bowel sounds are normal. She exhibits no distension. There is no abdominal tenderness.  Musculoskeletal: Normal range of motion.  Neurological: She is alert and oriented to person, place, and time. Gait normal.  Skin: Skin is warm and dry. No rash noted. She is not diaphoretic.  Psychiatric: Mood, memory, affect and judgment normal.  Nursing note and vitals reviewed.   Urgent Care Treatments / Results:   LABS: PLEASE NOTE: all labs that were ordered this encounter are listed, however only abnormal results are displayed. Labs Reviewed - No data to display  EKG: -None  RADIOLOGY: No results found.  PROCEDURES: Procedures  MEDICATIONS RECEIVED THIS VISIT: Medications - No data to display  PERTINENT CLINICAL COURSE NOTES/UPDATES:   Initial Impression / Assessment and Plan / Urgent Care Course:  Pertinent labs & imaging results that were available during my care of the patient were personally reviewed by me and considered in my medical decision making (see lab/imaging section of note for values and interpretations).  Cassandra Carrillo is a 53 y.o. female who presents to [facility] today with complaints of Facial Swelling (left)   Patient is well appearing overall in clinic today. She does not appear to be in any acute distress. Presenting symptoms (see HPI) and exam as documented above. Symptoms consistent with history of recurrent (chronic) sinusitis. She has  been taking her loratadine, fluticasone, and mucinex. Her LEFT appears infected. Will proceed with treatment using a 10 day course of Augmentin. Discussed supportive care measures at home during acute phase of illness. Patient to rest as much as possible. She was encouraged to ensure adequate hydration (water and ORS) to prevent dehydration and electrolyte derangements. Patient may use APAP and/or IBU on an as needed basis for pain/fever.   Current clinical condition warrants patient being out of work in order to recover from her current injury/illness. She was provided with the appropriate documentation to provide to her place of employment that will allow for her to RTW on 01/30/2019 with no restrictions.   Discussed follow up with primary care physician in 1 week for re-evaluation. I have reviewed the follow up and strict return precautions for any new or worsening symptoms. Patient is aware of symptoms that would be deemed urgent/emergent, and would thus require further evaluation either here or in the emergency department. At the time of discharge, she verbalized understanding and consent with the discharge plan as it was reviewed with her. All questions were fielded by provider and/or clinic staff prior to patient discharge.    Final Clinical Impressions / Urgent Care Diagnoses:   Final diagnoses:  Chronic sinusitis, unspecified location  Non-recurrent acute  suppurative otitis media of left ear without spontaneous rupture of tympanic membrane    New Prescriptions:  [place] Controlled Substance Registry consulted? Not Applicable  Meds ordered this encounter  Medications  . amoxicillin-clavulanate (AUGMENTIN) 875-125 MG tablet    Sig: Take 1 tablet by mouth 2 (two) times daily for 10 days.    Dispense:  20 tablet    Refill:  0    Recommended Follow up Care:  Patient encouraged to follow up with the following provider within the specified time frame, or sooner as dictated by the severity of  her symptoms. As always, she was instructed that for any urgent/emergent care needs, she should seek care either here or in the emergency department for more immediate evaluation.  Follow-up Information    PCP In 1 week.   Why: General reassessment of symptoms if not improving        NOTE: This note was prepared using Scientist, clinical (histocompatibility and immunogenetics)Dragon dictation software along with smaller Lobbyistphrase technology. Despite my best ability to proofread, there is the potential that transcriptional errors may still occur from this process, and are completely unintentional.    Verlee MonteGray, Hanif Radin E, NP 01/28/19 1115

## 2019-02-04 ENCOUNTER — Ambulatory Visit
Admission: EM | Admit: 2019-02-04 | Discharge: 2019-02-04 | Disposition: A | Discharge status: Home / Self Care | Payer: BLUE CROSS/BLUE SHIELD | Attending: Internal Medicine | Admitting: Internal Medicine

## 2019-02-04 ENCOUNTER — Other Ambulatory Visit: Payer: Self-pay

## 2019-02-04 DIAGNOSIS — R42 Dizziness and giddiness: Secondary | ICD-10-CM | POA: Diagnosis not present

## 2019-02-04 DIAGNOSIS — R269 Unspecified abnormalities of gait and mobility: Secondary | ICD-10-CM

## 2019-02-04 DIAGNOSIS — H9202 Otalgia, left ear: Secondary | ICD-10-CM

## 2019-02-04 MED ORDER — MECLIZINE HCL 25 MG PO TABS: 25.0000 mg | ORAL_TABLET | Freq: Three times a day (TID) | ORAL | 0 refills | Status: DC | PRN

## 2019-02-04 NOTE — ED Provider Notes (Signed)
[facility]    CSN: [id number] Arrival date & time: 02/04/19  1116      History   Chief Complaint Chief Complaint  Patient presents with  . Otalgia    HPI Cassandra Carrillo is a 53 y.o. female recently treated for acute bacterial sinusitis comes to urgent care with complaints of left-sided facial soreness and difficulty in maintaining her balance.  Facial pain, runny nose and facial swelling have improved since she was last seen in the urgent care.  Patient has a few days of Augmentin to complete the course of treatment.  No fever or chills.  No headaches.  No numbness or tingling.  She complains of dizziness without feeling of patient spinning of the room.  No nausea or vomiting.  Patient is not clear about vertigo symptoms.  She is asking for a note to excuse her from work.   No numbness or tingling.  No weakness/lateralizing signs.  HPI  History reviewed. No pertinent past medical history.  There are no active problems to display for this patient.   Past Surgical History:  Procedure Laterality Date  . NO PAST SURGERIES      OB History   No obstetric history on file.      Home Medications    Prior to Admission medications   Medication Sig Start Date End Date Taking? Authorizing Provider  amoxicillin-clavulanate (AUGMENTIN) 875-125 MG tablet Take 1 tablet by mouth 2 (two) times daily for 10 days. 01/28/19 02/07/19 Yes Verlee Monte, NP  fluticasone (FLONASE) 50 MCG/ACT nasal spray Place into the nose. 04/25/17  Yes [provider]  loratadine (CLARITIN) 10 MG tablet Take 1 tablet (10 mg total) by mouth daily. 12/04/17  Yes Renford Dills, NP  meclizine (ANTIVERT) 25 MG tablet Take 1 tablet (25 mg total) by mouth 3 (three) times daily as needed for dizziness. 02/04/19   LampteyBritta Mccreedy, MD    Family History Family History  Problem Relation Age of Onset  . Kidney disease Mother   . Heart disease Father     Social History Social History    Tobacco Use  . Smoking status: Never Smoker  . Smokeless tobacco: Never Used  Substance Use Topics  . Alcohol use: Never    Frequency: Never  . Drug use: Never     Allergies   Penicillins   Review of Systems Review of Systems  Constitutional: Positive for activity change. Negative for chills, fatigue and unexpected weight change.  HENT: Positive for ear pain, postnasal drip and sinus pressure. Negative for ear discharge, facial swelling, hearing loss, sinus pain, sore throat and voice change.   Respiratory: Negative.   Cardiovascular: Negative.   Gastrointestinal: Negative.   Genitourinary: Negative.  Negative for dysuria, frequency and urgency.  Musculoskeletal: Negative.   Neurological: Positive for dizziness and light-headedness. Negative for tremors, seizures, syncope, facial asymmetry, speech difficulty, weakness, numbness and headaches.  Psychiatric/Behavioral: Negative.      Physical Exam Triage Vital Signs ED Triage Vitals [02/04/19 1129]  Enc Vitals Group     BP (!) 143/86     Pulse Rate 72     Resp 16     Temp 98 F (36.7 C)     Temp Source Oral     SpO2 99 %     Weight 218 lb 4.1 oz (99 kg)     Height 5\' 4"  (1.626 m)     Head Circumference      Peak Flow  Pain Score 3     Pain Loc      Pain Edu?      Excl. in GC?    No data found.  Updated Vital Signs BP (!) 143/86 (BP Location: Left Arm)   Pulse 72   Temp 98 F (36.7 C) (Oral)   Resp 16   Ht  (1.626 m)   Wt 99 kg   SpO2 99%   BMI 37.46 kg/m   Visual Acuity Right Eye Distance:   Left Eye Distance:   Bilateral Distance:    Right Eye Near:   Left Eye Near:    Bilateral Near:     Physical Exam Vitals signs and nursing note reviewed.  Constitutional:      General: She is not in acute distress.    Appearance: She is not ill-appearing.  HENT:     Right Ear: Tympanic membrane normal.     Left Ear: Tympanic membrane normal.     Nose: Nose normal. No rhinorrhea.      Mouth/Throat:     Mouth: Mucous membranes are moist.     Pharynx: No oropharyngeal exudate or posterior oropharyngeal erythema.  Neck:     Musculoskeletal: Normal range of motion. No neck rigidity.  Cardiovascular:     Rate and Rhythm: Normal rate and regular rhythm.  Pulmonary:     Effort: Pulmonary effort is normal. No respiratory distress.     Breath sounds: Normal breath sounds. No wheezing, rhonchi or rales.  Abdominal:     General: Bowel sounds are normal. There is no distension.     Palpations: Abdomen is soft. There is no mass.     Tenderness: There is no guarding.     Hernia: No hernia is present.  Musculoskeletal: Normal range of motion.        General: No swelling or signs of injury.  Lymphadenopathy:     Cervical: No cervical adenopathy.  Skin:    Capillary Refill: Capillary refill takes less than 2 seconds.  Neurological:     General: No focal deficit present.     Mental Status: She is alert and oriented to person, place, and time.      UC Treatments / Results  Labs (all labs ordered are listed, but only abnormal results are displayed) Labs Reviewed - No data to display  EKG   Radiology No results found.  Procedures Procedures (including critical care time)  Medications Ordered in UC Medications - No data to display  Initial Impression / Assessment and Plan / UC Course  I have reviewed the triage vital signs and the nursing notes.  Pertinent labs & imaging results that were available during my care of the patient were reviewed by me and considered in my medical decision making (see chart for details).     1.  Gait imbalance likely secondary to acute sinusitis/middle ear effusion: Patient is advised to continue Mucinex Trial of meclizine 25 mg every 8 hours as needed Patient has no focal neurologic deficit.  She has some difficulty with her balance with tandem walking.  No other signs of cerebellar dysfunction. If symptoms persist in the coming week  or so patient may need imaging study to evaluate for other causes of gait imbalance.  Patient is advised to establish PCP care.  She is also advised to continue following up with her primary ENT physician.  Patient verbalizes understanding. Final Clinical Impressions(s) / UC Diagnoses   Final diagnoses:  Gait abnormality   Discharge Instructions  None    ED Prescriptions    Medication Sig Dispense Auth. Provider   meclizine (ANTIVERT) 25 MG tablet Take 1 tablet (25 mg total) by mouth 3 (three) times daily as needed for dizziness. 30 tablet Lamptey, Myrene Galas, MD     PDMP not reviewed this encounter.   Chase Picket, MD 02/04/19 1251

## 2019-02-04 NOTE — ED Triage Notes (Signed)
States that she was seen and treated for sinusitis, has a few ABX doses left. Pt reports left ear and left sided facial soreness and reports decreased balance with quick movement changes.  Pt alert and oriented X4, cooperative, RR even and unlabored, color WNL. Pt in NAD. Pt ambulates safely.

## 2019-02-11 ENCOUNTER — Other Ambulatory Visit: Payer: Self-pay

## 2019-02-11 DIAGNOSIS — Z20822 Contact with and (suspected) exposure to covid-19: Secondary | ICD-10-CM

## 2019-02-12 LAB — NOVEL CORONAVIRUS, NAA: SARS-CoV-2, NAA: NOT DETECTED

## 2019-02-15 ENCOUNTER — Other Ambulatory Visit: Payer: Self-pay

## 2019-02-15 DIAGNOSIS — Z20822 Contact with and (suspected) exposure to covid-19: Secondary | ICD-10-CM

## 2019-02-16 LAB — NOVEL CORONAVIRUS, NAA: SARS-CoV-2, NAA: NOT DETECTED

## 2019-02-18 ENCOUNTER — Telehealth: Payer: Self-pay | Admitting: General Practice

## 2019-02-18 NOTE — Telephone Encounter (Signed)
Patient called in and received her covid test result  °

## 2019-02-20 ENCOUNTER — Other Ambulatory Visit: Payer: Self-pay

## 2019-02-20 ENCOUNTER — Ambulatory Visit
Admission: EM | Admit: 2019-02-20 | Discharge: 2019-02-20 | Disposition: A | Discharge status: Home / Self Care | Payer: BLUE CROSS/BLUE SHIELD | Attending: Family Medicine | Admitting: Family Medicine

## 2019-02-20 DIAGNOSIS — J320 Chronic maxillary sinusitis: Secondary | ICD-10-CM | POA: Diagnosis not present

## 2019-02-20 DIAGNOSIS — R220 Localized swelling, mass and lump, head: Secondary | ICD-10-CM

## 2019-02-20 MED ORDER — PREDNISONE 10 MG PO TABS: ORAL_TABLET | ORAL | 0 refills | Status: DC

## 2019-02-20 NOTE — ED Provider Notes (Signed)
[facility]    CSN: [id number] Arrival date & time: 02/20/19  1501  History   Chief Complaint Chief Complaint  Patient presents with  . Facial Pain   HPI  53 year old female presents with facial pain and swelling.  Patient has known issues with sinusitis.  Has been seen here numerous times.  Has yet to see ear nose and throat.  States that she has an upcoming visit.  Was recently seen on 10/19 with chronic sinusitis.  Patient was treated with Augmentin.  Patient states that she got better but then worsened again.  Patient reports facial pain and swelling at the left maxillary region.  She reports some ear pressure as well.  No fever.  No runny nose.  No purulent nasal discharge.  No other respiratory symptoms.  Patient is requesting a "stronger antibiotic".  No relieving factors.  No other associated symptoms.  No other complaints.  PMH, Surgical Hx, Family Hx, Social History reviewed and updated as below.  PMH: Chronic sinusitis  Past Surgical History:  Procedure Laterality Date  . NO PAST SURGERIES      OB History   No obstetric history on file.      Home Medications    Prior to Admission medications   Medication Sig Start Date End Date Taking? Authorizing Provider  fluticasone (FLONASE) 50 MCG/ACT nasal spray Place into the nose. 04/25/17   [provider]  loratadine (CLARITIN) 10 MG tablet Take 1 tablet (10 mg total) by mouth daily. 12/04/17   Renford Dills, NP  meclizine (ANTIVERT) 25 MG tablet Take 1 tablet (25 mg total) by mouth 3 (three) times daily as needed for dizziness. 02/04/19   Lamptey, Britta Mccreedy, MD  predniSONE (DELTASONE) 10 MG tablet 50 mg daily x 2 days, then 40 mg daily x 2 days, then 30 mg daily x 2 days, then 20 mg daily x 2 days, then 10 mg daily x 2 days. 02/20/19   Tommie Sams, DO    Family History Family History  Problem Relation Age of Onset  . Kidney disease Mother   . Heart disease Father     Social History Social  History   Tobacco Use  . Smoking status: Never Smoker  . Smokeless tobacco: Never Used  Substance Use Topics  . Alcohol use: Never    Frequency: Never  . Drug use: Never     Allergies   Penicillins   Review of Systems Review of Systems  Constitutional: Negative.   HENT: Positive for facial swelling and sinus pain.    Physical Exam Triage Vital Signs ED Triage Vitals  Enc Vitals Group     BP 02/20/19 1550 135/69     Pulse Rate 02/20/19 1550 69     Resp 02/20/19 1550 20     Temp 02/20/19 1550 98.2 F (36.8 C)     Temp Source 02/20/19 1550 Oral     SpO2 02/20/19 1550 99 %     Weight 02/20/19 1548 218 lb 4.1 oz (99 kg)     Height --      Head Circumference --      Peak Flow --      Pain Score 02/20/19 1546 0     Pain Loc --      Pain Edu? --      Excl. in GC? --    Updated Vital Signs BP 135/69 (BP Location: Left Arm)   Pulse 69   Temp 98.2 F (36.8 C) (Oral)  Resp 20   Wt 99 kg   SpO2 99%   BMI 37.46 kg/m   Visual Acuity Right Eye Distance:   Left Eye Distance:   Bilateral Distance:    Right Eye Near:   Left Eye Near:    Bilateral Near:     Physical Exam Vitals signs and nursing note reviewed.  Constitutional:      General: She is not in acute distress.    Appearance: Normal appearance. She is obese. She is not ill-appearing.  HENT:     Head: Normocephalic and atraumatic.      Comments: Patient with tenderness over the left maxillary sinus.    Right Ear: Tympanic membrane normal.     Left Ear: Tympanic membrane normal.  Eyes:     General:        Right eye: No discharge.        Left eye: No discharge.     Conjunctiva/sclera: Conjunctivae normal.  Cardiovascular:     Rate and Rhythm: Normal rate and regular rhythm.     Heart sounds: No murmur.  Pulmonary:     Effort: Pulmonary effort is normal.     Breath sounds: Normal breath sounds. No wheezing, rhonchi or rales.  Neurological:     Mental Status: She is alert.  Psychiatric:         Mood and Affect: Mood normal.        Behavior: Behavior normal.    UC Treatments / Results  Labs (all labs ordered are listed, but only abnormal results are displayed) Labs Reviewed - No data to display  EKG   Radiology No results found.  Procedures Procedures (including critical care time)  Medications Ordered in UC Medications - No data to display  Initial Impression / Assessment and Plan / UC Course  I have reviewed the triage vital signs and the nursing notes.  Pertinent labs & imaging results that were available during my care of the patient were reviewed by me and considered in my medical decision making (see chart for details).    52 year old female presents with chronic sinusitis.  I do not feel that antibiotic therapy is warranted at this time.  Placing on prednisone taper.  Advised to see ear nose and throat.  Final Clinical Impressions(s) / UC Diagnoses   Final diagnoses:  Facial swelling  Chronic maxillary sinusitis     Discharge Instructions     Medication as prescribed.  See ENT.  Take care  Dr. Lacinda Axon    ED Prescriptions    Medication Sig Dispense Auth. Provider   predniSONE (DELTASONE) 10 MG tablet 50 mg daily x 2 days, then 40 mg daily x 2 days, then 30 mg daily x 2 days, then 20 mg daily x 2 days, then 10 mg daily x 2 days. 30 tablet Coral Spikes, DO     PDMP not reviewed this encounter.   Coral Spikes, [place] 02/20/19 1900

## 2019-02-20 NOTE — ED Triage Notes (Addendum)
Pt. States she was seen on 10/19 for her facial pain & was given antibiotics and it was helping her facial pain then it stopped being effective. Her face is back puffy and swollen like her 1st visit, ear pressure has developed too.

## 2019-02-20 NOTE — Discharge Instructions (Signed)
Medication as prescribed.  See ENT.  Take care  Dr. Aftyn Nott  

## 2019-04-10 ENCOUNTER — Encounter: Payer: Self-pay | Admitting: Otolaryngology

## 2019-04-10 ENCOUNTER — Other Ambulatory Visit: Payer: Self-pay

## 2019-04-15 ENCOUNTER — Other Ambulatory Visit
Admission: RE | Admit: 2019-04-15 | Discharge: 2019-04-15 | Disposition: A | Discharge status: Home / Self Care | Payer: 59 | Source: Ambulatory Visit | Attending: Otolaryngology | Admitting: Otolaryngology

## 2019-04-15 DIAGNOSIS — Z01812 Encounter for preprocedural laboratory examination: Secondary | ICD-10-CM | POA: Diagnosis present

## 2019-04-15 DIAGNOSIS — Z20822 Contact with and (suspected) exposure to covid-19: Secondary | ICD-10-CM | POA: Insufficient documentation

## 2019-04-15 LAB — SARS CORONAVIRUS 2 (TAT 6-24 HRS): SARS Coronavirus 2: NEGATIVE

## 2019-04-15 NOTE — Discharge Instructions (Signed)
[facility] ENDOSCOPIC SINUS SURGERY [facility]  What is Functional Endoscopic Sinus Surgery?  The Surgery involves making the natural openings of the sinuses larger by removing the bony partitions that separate the sinuses from the nasal cavity.  The natural sinus lining is preserved as much as possible to allow the sinuses to resume normal function after the surgery.  In some patients nasal polyps (excessively swollen lining of the sinuses) may be removed to relieve obstruction of the sinus openings.  The surgery is performed through the nose using lighted scopes, which eliminates the need for incisions on the face.  A septoplasty is a different procedure which is sometimes performed with sinus surgery.  It involves straightening the boy partition that separates the two sides of your nose.  A crooked or deviated septum may need repair if is obstructing the sinuses or nasal airflow.  Turbinate reduction is also often performed during sinus surgery.  The turbinates are bony proturberances from the side walls of the nose which swell and can obstruct the nose in patients with sinus and allergy problems.  Their size can be surgically reduced to help relieve nasal obstruction.  What Can Sinus Surgery Do For Me?  Sinus surgery can reduce the frequency of sinus infections requiring antibiotic treatment.  This can provide improvement in nasal congestion, post-nasal drainage, facial pressure and nasal obstruction.  Surgery will NOT prevent you from ever having an infection again, so it usually only for patients who get infections 4 or more times yearly requiring antibiotics, or for infections that do not clear with antibiotics.  It will not cure nasal allergies, so patients with allergies may still require medication to treat their allergies after surgery. Surgery may improve headaches related to sinusitis, however, some people will continue to  require medication to control sinus headaches related to allergies.  Surgery will do nothing for other forms of headache (migraine, tension or cluster).  What Are the Risks of Endoscopic Sinus Surgery?  Current techniques allow surgery to be performed safely with little risk, however, there are rare complications that patients should be aware of.  Because the sinuses are located around the eyes, there is risk of eye injury, including blindness, though again, this would be quite rare. This is usually a result of bleeding behind the eye during surgery, which puts the vision oat risk, though there are treatments to protect the vision and prevent permanent disrupted by surgery causing a leak of the spinal fluid that surrounds the brain.  More serious complications would include bleeding inside the brain cavity or damage to the brain.  Again, all of these complications are uncommon, and spinal fluid leaks can be safely managed surgically if they occur.  The most common complication of sinus surgery is bleeding from the nose, which may require packing or cauterization of the nose.  Continued sinus have polyps may experience recurrence of the polyps requiring revision surgery.  Alterations of sense of smell or injury to the tear ducts are also rare complications.   What is the Surgery Like, and what is the Recovery?  The Surgery usually takes a couple of hours to perform, and is usually performed under a general anesthetic (completely asleep).  Patients are usually discharged home after a couple of hours.  Sometimes during surgery it is necessary to pack the nose to control bleeding, and the packing is left in place for 24 - 48 hours, and removed by your surgeon.    If a septoplasty was performed during the procedure, there is often a splint placed which must be removed after 5-7 days.   Discomfort: Pain is usually mild to moderate, and can be controlled by prescription pain medication or acetaminophen (Tylenol).   Aspirin, Ibuprofen (Advil, Motrin), or Naprosyn (Aleve) should be avoided, as they can cause increased bleeding.  Most patients feel sinus pressure like they have a bad head cold for several days.  Sleeping with your head elevated can help reduce swelling and facial pressure, as can ice packs over the face.  A humidifier may be helpful to keep the mucous and blood from drying in the nose.   Diet: There are no specific diet restrictions, however, you should generally start with clear liquids and a light diet of bland foods because the anesthetic can cause some nausea.  Advance your diet depending on how your stomach feels.  Taking your pain medication with food will often help reduce stomach upset which pain medications can cause.  Nasal Saline Irrigation: It is important to remove blood clots and dried mucous from the nose as it is healing.  This is done by having you irrigate the nose at least 3 - 4 times daily with a salt water solution.  We recommend using NeilMed Sinus Rinse (available at the drug store).  Fill the squeeze bottle with the solution, bend over a sink, and insert the tip of the squeeze bottle into the nose  of an inch.  Point the tip of the squeeze bottle towards the inside corner of the eye on the same side your irrigating.  Squeeze the bottle and gently irrigate the nose.  If you bend forward as you do this, most of the fluid will flow back out of the nose, instead of down your throat.   The solution should be warm, near body temperature, when you irrigate.   Each time you irrigate, you should use a full squeeze bottle.   Note that if you are instructed to use Nasal Steroid Sprays at any time after your surgery, irrigate with saline BEFORE using the steroid spray, so you do not wash it all out of the nose. Another product, Nasal Saline Gel (such as AYR Nasal Saline Gel) can be applied in each nostril 3 - 4 times daily to moisture the nose and reduce scabbing or crusting.  Bleeding:   Bloody drainage from the nose can be expected for several days, and patients are instructed to irrigate their nose frequently with salt water to help remove mucous and blood clots.  The drainage may be dark red or brown, though some fresh blood may be seen intermittently, especially after irrigation.  Do not blow you nose, as bleeding may occur. If you must sneeze, keep your mouth open to allow air to escape through your mouth.  If heavy bleeding occurs: Irrigate the nose with saline to rinse out clots, then spray the nose 3 - 4 times with Afrin Nasal Decongestant Spray.  The spray will constrict the blood vessels to slow bleeding.  Pinch the lower half of your nose shut to apply pressure, and lay down with your head elevated.  Ice packs over the nose may help as well. If bleeding persists despite these measures, you should notify your doctor.  Do not use the Afrin routinely to control nasal congestion after surgery, as it can result in worsening congestion and may affect healing.     Activity: Return to work varies among patients. Most patients will be   out of work at least 5 - 7 days to recover.  Patient may return to work after they are off of narcotic pain medication, and feeling well enough to perform the functions of their job.  Patients must avoid heavy lifting (over 10 pounds) or strenuous physical for 2 weeks after surgery, so your employer may need to assign you to light duty, or keep you out of work longer if light duty is not possible.  NOTE: you should not drive, operate dangerous machinery, do any mentally demanding tasks or make any important legal or financial decisions while on narcotic pain medication and recovering from the general anesthetic.    Call Your Doctor Immediately if You Have Any of the Following: 1. Bleeding that you cannot control with the above measures 2. Loss of vision, double vision, bulging of the eye or black eyes. 3. Fever over 101 degrees 4. Neck stiffness with  severe headache, fever, nausea and change in mental state. You are always encourage to call anytime with concerns, however, please call with requests for pain medication refills during office hours.  Office Endoscopy: During follow-up visits your doctor will remove any packing or splints that may have been placed and evaluate and clean your sinuses endoscopically.  Topical anesthetic will be used to make this as comfortable as possible, though you may want to take your pain medication prior to the visit.  How often this will need to be done varies from patient to patient.  After complete recovery from the surgery, you may need follow-up endoscopy from time to time, particularly if there is concern of recurrent infection or nasal polyps.  General Anesthesia, Adult, Care After This sheet gives you information about how to care for yourself after your procedure. Your health care provider may also give you more specific instructions. If you have problems or questions, contact your health care provider. What can I expect after the procedure? After the procedure, the following side effects are common:  Pain or discomfort at the IV site.  Nausea.  Vomiting.  Sore throat.  Trouble concentrating.  Feeling cold or chills.  Weak or tired.  Sleepiness and fatigue.  Soreness and body aches. These side effects can affect parts of the body that were not involved in surgery. Follow these instructions at home:  For at least 24 hours after the procedure:  Have a responsible adult stay with you. It is important to have someone help care for you until you are awake and alert.  Rest as needed.  Do not: ? Participate in activities in which you could fall or become injured. ? Drive. ? Use heavy machinery. ? Drink alcohol. ? Take sleeping pills or medicines that cause drowsiness. ? Make important decisions or sign legal documents. ? Take care of children on your own. Eating and drinking  Follow  any instructions from your health care provider about eating or drinking restrictions.  When you feel hungry, start by eating small amounts of foods that are soft and easy to digest (bland), such as toast. Gradually return to your regular diet.  Drink enough fluid to keep your urine pale yellow.  If you vomit, rehydrate by drinking water, juice, or clear broth. General instructions  If you have sleep apnea, surgery and certain medicines can increase your risk for breathing problems. Follow instructions from your health care provider about wearing your sleep device: ? Anytime you are sleeping, including during daytime naps. ? While taking prescription pain medicines, sleeping medicines, or medicines that   make you drowsy.  Return to your normal activities as told by your health care provider. Ask your health care provider what activities are safe for you.  Take over-the-counter and prescription medicines only as told by your health care provider.  If you smoke, do not smoke without supervision.  Keep all follow-up visits as told by your health care provider. This is important. Contact a health care provider if:  You have nausea or vomiting that does not get better with medicine.  You cannot eat or drink without vomiting.  You have pain that does not get better with medicine.  You are unable to pass urine.  You develop a skin rash.  You have a fever.  You have redness around your IV site that gets worse. Get help right away if:  You have difficulty breathing.  You have chest pain.  You have blood in your urine or stool, or you vomit blood. Summary  After the procedure, it is common to have a sore throat or nausea. It is also common to feel tired.  Have a responsible adult stay with you for the first 24 hours after general anesthesia. It is important to have someone help care for you until you are awake and alert.  When you feel hungry, start by eating small amounts of  foods that are soft and easy to digest (bland), such as toast. Gradually return to your regular diet.  Drink enough fluid to keep your urine pale yellow.  Return to your normal activities as told by your health care provider. Ask your health care provider what activities are safe for you. This information is not intended to replace advice given to you by your health care provider. Make sure you discuss any questions you have with your health care provider. Document Revised: 03/31/2017 Document Reviewed: 11/11/2016 Elsevier Patient Education  2020 [employer] Inc[employer]  

## 2019-04-18 ENCOUNTER — Ambulatory Visit
Admission: RE | Admit: 2019-04-18 | Discharge: 2019-04-18 | Disposition: A | Discharge status: Home / Self Care | Payer: 59 | Attending: Otolaryngology | Admitting: Otolaryngology

## 2019-04-18 ENCOUNTER — Encounter: Payer: Self-pay | Admitting: Otolaryngology

## 2019-04-18 ENCOUNTER — Ambulatory Visit: Payer: 59 | Admitting: Anesthesiology

## 2019-04-18 ENCOUNTER — Encounter
Admission: RE | Disposition: A | Discharge status: Home / Self Care | Payer: Self-pay | Source: Home / Self Care | Attending: Otolaryngology

## 2019-04-18 ENCOUNTER — Other Ambulatory Visit: Payer: Self-pay

## 2019-04-18 DIAGNOSIS — J343 Hypertrophy of nasal turbinates: Secondary | ICD-10-CM | POA: Diagnosis not present

## 2019-04-18 DIAGNOSIS — J342 Deviated nasal septum: Secondary | ICD-10-CM | POA: Insufficient documentation

## 2019-04-18 DIAGNOSIS — J3489 Other specified disorders of nose and nasal sinuses: Secondary | ICD-10-CM | POA: Diagnosis not present

## 2019-04-18 HISTORY — DX: Unspecified osteoarthritis, unspecified site: M19.90

## 2019-04-18 HISTORY — PX: ENDOSCOPIC CONCHA BULLOSA RESECTION: SHX6395

## 2019-04-18 HISTORY — PX: SEPTOPLASTY: SHX2393

## 2019-04-18 HISTORY — DX: Dizziness and giddiness: R42

## 2019-04-18 HISTORY — PX: TURBINATE REDUCTION: SHX6157

## 2019-04-18 LAB — POCT PREGNANCY, URINE: Preg Test, Ur: NEGATIVE

## 2019-04-18 SURGERY — SEPTOPLASTY, NOSE
Anesthesia: General | Site: Nose | Laterality: Right

## 2019-04-18 MED ORDER — FENTANYL CITRATE (PF) 100 MCG/2ML IJ SOLN
INTRAMUSCULAR | Status: DC | PRN
  Administered 2019-04-18 (×2): 50 ug via INTRAVENOUS

## 2019-04-18 MED ORDER — PREDNISONE 10 MG PO TABS: ORAL_TABLET | ORAL | 0 refills | Status: DC

## 2019-04-18 MED ORDER — SUCCINYLCHOLINE CHLORIDE 20 MG/ML IJ SOLN
INTRAMUSCULAR | Status: DC | PRN
  Administered 2019-04-18: 100 mg via INTRAVENOUS

## 2019-04-18 MED ORDER — FENTANYL CITRATE (PF) 100 MCG/2ML IJ SOLN: 25.0000 ug | INTRAMUSCULAR | Status: DC | PRN

## 2019-04-18 MED ORDER — LACTATED RINGERS IV SOLN: INTRAVENOUS | Status: DC

## 2019-04-18 MED ORDER — LIDOCAINE HCL (CARDIAC) PF 100 MG/5ML IV SOSY
PREFILLED_SYRINGE | INTRAVENOUS | Status: DC | PRN
  Administered 2019-04-18: 40 mg via INTRAVENOUS

## 2019-04-18 MED ORDER — ONDANSETRON HCL 4 MG/2ML IJ SOLN: 4.0000 mg | Freq: Once | INTRAMUSCULAR | Status: DC | PRN

## 2019-04-18 MED ORDER — OXYMETAZOLINE HCL 0.05 % NA SOLN
2.0000 | Freq: Once | NASAL | Status: AC
  Administered 2019-04-18: 2 via NASAL

## 2019-04-18 MED ORDER — PROPOFOL 10 MG/ML IV BOLUS
INTRAVENOUS | Status: DC | PRN
  Administered 2019-04-18: 50 mg via INTRAVENOUS
  Administered 2019-04-18: 150 mg via INTRAVENOUS

## 2019-04-18 MED ORDER — GLYCOPYRROLATE 0.2 MG/ML IJ SOLN
INTRAMUSCULAR | Status: DC | PRN
  Administered 2019-04-18: .1 mg via INTRAVENOUS

## 2019-04-18 MED ORDER — MIDAZOLAM HCL 5 MG/5ML IJ SOLN
INTRAMUSCULAR | Status: DC | PRN
  Administered 2019-04-18: 2 mg via INTRAVENOUS

## 2019-04-18 MED ORDER — CEPHALEXIN 500 MG PO CAPS: 500.0000 mg | ORAL_CAPSULE | Freq: Two times a day (BID) | ORAL | 0 refills | Status: DC

## 2019-04-18 MED ORDER — HYDROCODONE-ACETAMINOPHEN 5-325 MG PO TABS: 1.0000 | ORAL_TABLET | Freq: Four times a day (QID) | ORAL | 0 refills | Status: AC | PRN

## 2019-04-18 MED ORDER — KETOROLAC TROMETHAMINE 30 MG/ML IJ SOLN: 15.0000 mg | Freq: Once | INTRAMUSCULAR | Status: DC | PRN

## 2019-04-18 MED ORDER — DEXTROSE 5 % IV SOLN
2000.0000 mg | Freq: Once | INTRAVENOUS | Status: AC
  Administered 2019-04-18: 09:00:00 2000 mg via INTRAVENOUS

## 2019-04-18 MED ORDER — ACETAMINOPHEN 10 MG/ML IV SOLN
1000.0000 mg | Freq: Once | INTRAVENOUS | Status: AC
  Administered 2019-04-18: 08:00:00 1000 mg via INTRAVENOUS

## 2019-04-18 MED ORDER — ONDANSETRON HCL 4 MG/2ML IJ SOLN
INTRAMUSCULAR | Status: DC | PRN
  Administered 2019-04-18: 4 mg via INTRAVENOUS

## 2019-04-18 MED ORDER — SCOPOLAMINE 1 MG/3DAYS TD PT72
1.0000 | MEDICATED_PATCH | Freq: Once | TRANSDERMAL | Status: DC
  Administered 2019-04-18: 08:00:00 1.5 mg via TRANSDERMAL

## 2019-04-18 MED ORDER — DEXAMETHASONE SODIUM PHOSPHATE 4 MG/ML IJ SOLN
INTRAMUSCULAR | Status: DC | PRN
  Administered 2019-04-18: 10 mg via INTRAVENOUS

## 2019-04-18 MED ORDER — PHENYLEPHRINE HCL 0.5 % NA SOLN
NASAL | Status: DC | PRN
  Administered 2019-04-18: 15 mL via TOPICAL

## 2019-04-18 MED ORDER — OXYCODONE HCL 5 MG/5ML PO SOLN: 5.0000 mg | Freq: Once | ORAL | Status: DC | PRN

## 2019-04-18 MED ORDER — LIDOCAINE-EPINEPHRINE 1 %-1:100000 IJ SOLN
INTRAMUSCULAR | Status: DC | PRN
  Administered 2019-04-18: 6 mL

## 2019-04-18 MED ORDER — OXYCODONE HCL 5 MG PO TABS: 5.0000 mg | ORAL_TABLET | Freq: Once | ORAL | Status: DC | PRN

## 2019-04-18 MED ORDER — EPHEDRINE SULFATE 50 MG/ML IJ SOLN
INTRAMUSCULAR | Status: DC | PRN
  Administered 2019-04-18: 5 mg via INTRAVENOUS

## 2019-04-18 SURGICAL SUPPLY — 28 items
CANISTER SUCT 1200ML W/VALVE (MISCELLANEOUS) ×4 IMPLANT
COAGULATOR SUCT 8FR VV (MISCELLANEOUS) ×4 IMPLANT
DRAPE HEAD BAR (DRAPES) ×4 IMPLANT
ELECT REM PT RETURN 9FT ADLT (ELECTROSURGICAL) ×4
ELECTRODE REM PT RTRN 9FT ADLT (ELECTROSURGICAL) ×2 IMPLANT
GLOVE PI ULTRA LF STRL 7.5 (GLOVE) ×4 IMPLANT
GLOVE PI ULTRA NON LATEX 7.5 (GLOVE) ×4
GOWN STRL REUS W/ TWL LRG LVL3 (GOWN DISPOSABLE) ×2 IMPLANT
GOWN STRL REUS W/TWL LRG LVL3 (GOWN DISPOSABLE) ×2
KIT TURNOVER KIT A (KITS) ×4 IMPLANT
NEEDLE ANESTHESIA  27G X 3.5 (NEEDLE) ×2
NEEDLE ANESTHESIA 27G X 3.5 (NEEDLE) ×2 IMPLANT
NEEDLE HYPO 27GX1-1/4 (NEEDLE) ×4 IMPLANT
PACK ENT CUSTOM (PACKS) ×4 IMPLANT
PACKING NASAL EPIS 4X2.4 XEROG (MISCELLANEOUS) ×4 IMPLANT
PATTIES SURGICAL .5 X3 (DISPOSABLE) ×4 IMPLANT
SOL ANTI-FOG 6CC FOG-OUT (MISCELLANEOUS) ×2 IMPLANT
SOL FOG-OUT ANTI-FOG 6CC (MISCELLANEOUS) ×2
SPLINT NASAL SEPTAL BLV .50 ST (MISCELLANEOUS) ×4 IMPLANT
STRAP BODY AND KNEE 60X3 (MISCELLANEOUS) ×4 IMPLANT
SUT CHROMIC 3-0 (SUTURE) ×2
SUT CHROMIC 3-0 KS 27XMFL CR (SUTURE) ×2
SUT ETHILON 3-0 KS 30 BLK (SUTURE) ×4 IMPLANT
SUT PLAIN GUT 4-0 (SUTURE) ×4 IMPLANT
SUTURE CHRMC 3-0 KS 27XMFL CR (SUTURE) ×2 IMPLANT
SYR 3ML LL SCALE MARK (SYRINGE) ×4 IMPLANT
TOWEL OR 17X26 4PK STRL BLUE (TOWEL DISPOSABLE) ×4 IMPLANT
WATER STERILE IRR 250ML POUR (IV SOLUTION) ×4 IMPLANT

## 2019-04-18 NOTE — Anesthesia Preprocedure Evaluation (Signed)
Anesthesia Evaluation  Patient identified by MRN, date of birth, ID band Patient awake    Reviewed: Allergy & Precautions, H&P , NPO status , Patient's Chart, lab work & pertinent test results, reviewed documented beta blocker date and time   Airway Mallampati: II  TM Distance: >3 FB Neck ROM: full    Dental no notable dental hx.    Pulmonary neg pulmonary ROS,    Pulmonary exam normal breath sounds clear to auscultation       Cardiovascular Exercise Tolerance: Good negative cardio ROS   Rhythm:regular Rate:Normal     Neuro/Psych Vertigo x1 with sinus infection negative neurological ROS  negative psych ROS   GI/Hepatic negative GI ROS, Neg liver ROS,   Endo/Other  negative endocrine ROS  Renal/GU negative Renal ROS  negative genitourinary   Musculoskeletal   Abdominal   Peds  Hematology negative hematology ROS (+)   Anesthesia Other Findings   Reproductive/Obstetrics negative OB ROS                             Anesthesia Physical Anesthesia Plan  ASA: II  Anesthesia Plan: General ETT   Post-op Pain Management:    Induction:   PONV Risk Score and Plan: 3 and Ondansetron, Dexamethasone and Scopolamine patch - Pre-op  Airway Management Planned:   Additional Equipment:   Intra-op Plan:   Post-operative Plan:   Informed Consent: I have reviewed the patients History and Physical, chart, labs and discussed the procedure including the risks, benefits and alternatives for the proposed anesthesia with the patient or authorized representative who has indicated his/her understanding and acceptance.     Dental Advisory Given  Plan Discussed with: CRNA  Anesthesia Plan Comments:         Anesthesia Quick Evaluation

## 2019-04-18 NOTE — H&P (Signed)
H&P has been reviewed and patient reevaluated, no changes necessary. To be downloaded later.  

## 2019-04-18 NOTE — Transfer of Care (Signed)
Immediate Anesthesia Transfer of Care Note  Patient: Cassandra Carrillo  Procedure(s) Performed: SEPTOPLASTY (Bilateral Nose) TURBINATE REDUCTION (Bilateral Nose) ENDOSCOPIC CONCHA BULLOSA RESECTION (Right Nose)  Patient Location: PACU  Anesthesia Type: General ETT  Level of Consciousness: awake, alert  and patient cooperative  Airway and Oxygen Therapy: Patient Spontanous Breathing and Patient connected to supplemental oxygen  Post-op Assessment: Post-op Vital signs reviewed, Patient's Cardiovascular Status Stable, Respiratory Function Stable, Patent Airway and No signs of Nausea or vomiting  Post-op Vital Signs: Reviewed and stable  Complications: No apparent anesthesia complications

## 2019-04-18 NOTE — Anesthesia Procedure Notes (Addendum)
Procedure Name: Intubation Date/Time: 04/18/2019 8:52 AM Performed by: Jimmy Picket, CRNA Pre-anesthesia Checklist: Patient identified, Emergency Drugs available, Suction available, Patient being monitored and Timeout performed Patient Re-evaluated:Patient Re-evaluated prior to induction Oxygen Delivery Method: Circle system utilized Preoxygenation: Pre-oxygenation with 100% oxygen Induction Type: IV induction Ventilation: Mask ventilation without difficulty Laryngoscope Size: Miller and 2 Grade View: Grade I Tube type: Oral Rae Tube size: 7.0 mm Number of attempts: 1 Placement Confirmation: ETT inserted through vocal cords under direct vision,  positive ETCO2 and breath sounds checked- equal and bilateral Tube secured with: Tape Dental Injury: Teeth and Oropharynx as per pre-operative assessment

## 2019-04-18 NOTE — Anesthesia Postprocedure Evaluation (Signed)
Anesthesia Post Note  Patient: [name]  Procedure(s) Performed: SEPTOPLASTY (Bilateral Nose) TURBINATE REDUCTION (Bilateral Nose) ENDOSCOPIC CONCHA BULLOSA RESECTION (Right Nose)     Patient location during evaluation: PACU Anesthesia Type: General Level of consciousness: awake and alert Pain management: pain level controlled Vital Signs Assessment: post-procedure vital signs reviewed and stable Respiratory status: spontaneous breathing, nonlabored ventilation, respiratory function stable and patient connected to nasal cannula oxygen Cardiovascular status: blood pressure returned to baseline and stable Postop Assessment: no apparent nausea or vomiting Anesthetic complications: no    Cassandra Carrillo

## 2019-04-18 NOTE — Op Note (Signed)
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  The left middle turbinate was thin and lateralized.  The right middle turbinate was enlarged with a large concha bullosa.  The inferior turbinates were enlarged.  Procedure: With the patient in a comfortable supine position,  general orotracheal anesthesia was induced without difficulty.     The patient received preoperative Afrin spray for topical decongestion and vasoconstriction.  Intravenous prophylactic antibiotics were administered.  At an appropriate level, the patient was placed in a semi-sitting position.  Nasal vibrissae were trimmed.   1% Xylocaine with 1:100,000 epinephrine, 6 oh cc's, was infiltrated into the anterior floor of the nose, into the nasal spine region, into the membranous columella, and finally into the submucoperichondrial plane of the septum on both sides.  Several minutes were allowed for this to take effect.  Cottoniod pledgetts soaked in Afrin and 4% Xylocaine were placed into both nasal cavities and left while the patient was prepped and draped in the standard fashion.  The materials were removed from the nose and observed to be intact and correct in number.  The nose was inspected with a headlight and zero degree scope with the findings as described above.  A left Killian incision was sharply executed and carried down to the quadrangular cartilage. The mucoperichondrium was elelvated along the quadrangular plate back to the bony-cartilaginous junction. The mucoperiostium was then elevated along the ethmoid  plate and the vomer. The boney-catilaginous junction was then split with a freer elevator and the mucoperiosteum was elevated on the opposite side. The mucoperiosteum was then elevated along the maxillary crest as needed to expose the crooked bone of the crest.  Boney spurs of the vomer and maxillary crest were removed with Lenoria Chime forceps.  The cartilaginous plate was trimmed along its posterior and inferior borders of about 2 mm of cartilage to free it up inferiorly. Some of the deviated ethmoid plate was then fractured and removed with Takahashi forceps to free up the posterior border of the quadrangular plate and allow it to swing back to the midline. The mucosal flaps were placed back into their anatomic position to allow visualization of the airways. The septum now sat in the midline with an improved airway.  A 3-0 Chromic suture on a Keith needle in used to anchor the inferior septum at the nasal spine with a through and through suture. The mucosal flaps are then sutured together using a through and through whip stitch of 4-0 Plain Gut with a mini-Keith needle. This was used to close the [place] incision as well.   The inferior turbinates were then inspected. An incision was created along the inferior aspect of the left inferior turbinate with removal of some of the inferior soft tissue and bone. Electrocautery was used to control bleeding in the area. The remaining turbinate was then outfractured to open up the airway further. There was no significant bleeding noted. The right turbinate was then trimmed and outfractured in a similar fashion.  The 0 degree scope was then used to visualize the right middle turbinate.  It was very wide and pushing  into the septum some.  The anterior border of the middle turbinate was trimmed using a Gruenwald forceps and a large concha bullosa was found.  The lateral border of the concha bullosa was trimmed away with through biting forceps and the turbinate was  rounded off to create a J shaped form.  The trimming was all done with through biting forceps.  Electrocautery was used along some was trimmed edge to help control oozing.  Xerogel was placed over the trimmed surface of the turbinate help control bleeding.  The 0 degree scope was used in the left side and the middle turbinate was infractured to open up the middle meatus.  Xerogel was placed in the middle meatus to help hold this more medially.  The airways were then visualized and showed open passageways on both sides that were significantly improved compared to before surgery. There was no signifcant bleeding. Nasal splints were applied to both sides of the septum using Xomed 0.46mm regular sized splints that were trimmed, and then held in position with a 3-0 Nylon through and through suture.  The patient was turned back over to anesthesia, and awakened, extubated, and taken to the PACU in satisfactory condition.  Dispo:   PACU to home  Plan: Ice, elevation, narcotic analgesia, steroid taper, and prophylactic antibiotics for the duration of indwelling nasal foreign bodies.  We will reevaluate the patient in the office in 6 days and remove the septal splints.  Return to work in 10 days, strenuous activities in two weeks.   Elon Alas Van Ehlert 04/18/2019 10:16 AM

## 2019-04-19 ENCOUNTER — Encounter: Payer: Self-pay | Admitting: *Deleted

## 2019-05-14 ENCOUNTER — Other Ambulatory Visit: Payer: Self-pay

## 2019-05-14 ENCOUNTER — Encounter: Payer: Self-pay | Admitting: Emergency Medicine

## 2019-05-14 ENCOUNTER — Ambulatory Visit
Admission: EM | Admit: 2019-05-14 | Discharge: 2019-05-14 | Disposition: A | Discharge status: Home / Self Care | Payer: 59

## 2019-05-14 DIAGNOSIS — L03115 Cellulitis of right lower limb: Secondary | ICD-10-CM

## 2019-05-14 DIAGNOSIS — R600 Localized edema: Secondary | ICD-10-CM

## 2019-05-14 DIAGNOSIS — L03116 Cellulitis of left lower limb: Secondary | ICD-10-CM | POA: Diagnosis not present

## 2019-05-14 DIAGNOSIS — M79604 Pain in right leg: Secondary | ICD-10-CM

## 2019-05-14 MED ORDER — PREDNISONE 10 MG (21) PO TBPK: ORAL_TABLET | Freq: Every day | ORAL | 0 refills | Status: DC

## 2019-05-14 MED ORDER — DOXYCYCLINE HYCLATE 100 MG PO CAPS: 100.0000 mg | ORAL_CAPSULE | Freq: Two times a day (BID) | ORAL | 0 refills | Status: DC

## 2019-05-14 NOTE — Discharge Instructions (Addendum)
It was very nice seeing you today in clinic. Thank you for entrusting me with your care.   Doubt that there are clots in your legs, however as discussed with degree of pain and swelling that you are having, it would be prudent to assess. You indicated that you did not want to have this done today at [facility]. I have scheduled you an appointment at the hospital tomorrow at 3:30. I will call you if the studies result as positive.   I am going to proceed with sending in the medication for the presumed cellulitis. May use Tylenol as needed for pain.    If your symptoms/condition worsens, please seek follow up care either here or in the ER. Please remember, our [facility] providers are "right here with you" when you need [place].   Again, it was my pleasure to take care of you today. Thank you for choosing our clinic. I hope that you start to feel better quickly.   Quentin Mulling, MSN, APRN, FNP-C, CEN Advanced Practice Provider  [facility]

## 2019-05-14 NOTE — ED Triage Notes (Signed)
Pt c/o bilateral leg and foot swelling. Started about 3 weeks ago. Her leg and feet are red and feel warm to the touch. She states that she has intermittent swelling for months before this started. Denies shortness of or chest pain.

## 2019-05-15 ENCOUNTER — Encounter: Payer: Self-pay | Admitting: Urgent Care

## 2019-05-15 ENCOUNTER — Ambulatory Visit
Admission: RE | Admit: 2019-05-15 | Discharge: 2019-05-15 | Disposition: A | Discharge status: Home / Self Care | Payer: 59 | Source: Ambulatory Visit | Attending: Urgent Care | Admitting: Urgent Care

## 2019-05-15 DIAGNOSIS — M79605 Pain in left leg: Secondary | ICD-10-CM | POA: Insufficient documentation

## 2019-05-15 DIAGNOSIS — M79604 Pain in right leg: Secondary | ICD-10-CM | POA: Diagnosis present

## 2019-05-15 DIAGNOSIS — M7989 Other specified soft tissue disorders: Secondary | ICD-10-CM | POA: Insufficient documentation

## 2019-05-15 NOTE — ED Provider Notes (Signed)
[place], [place]   Name: Cassandra Carrillo DOB: 1966/01/25 MRN: [id number] CSN: [id number] PCP: Patient, No Pcp Per  Arrival date and time:  05/14/19 1415  Chief Complaint:  Leg Swelling (bilateral)   NOTE: Prior to seeing the patient today, I have reviewed the triage nursing documentation and vital signs. Clinical staff has updated patient's PMH/PSHx, current medication list, and drug allergies/intolerances to ensure comprehensive history available to assist in medical decision making.   History:   HPI: Cassandra Carrillo is a 54 y.o. female who presents today with complaints of pain and swelling in her BILATERAL lower extremities. Symptoms initially began 3 weeks ago and have progressively worsened each day. Patient reporting painful ambulation. She notes that prior to the onset of her persistent swelling, her legs/feet would intermittently swell, however they would always improve with elevation. She adds that her legs have never been painful like this in the past. Patient presents today with BILATERAL lower extremities that are erythematous, tender, and warm to the touch. She denies any systemic signs of infection; no fever, chills, nausea, vomiting, tachycardia, or hypotension. She has not experienced any associated shortness of breath, chest pain, or palpitations. Patient denies any recent travel. She does make mention of a recent sinus surgery that resulted in her being less active than normal.   Past Medical History:  Diagnosis Date  . Arthritis    fingers  . Vertigo    x1 with sinus infection    Past Surgical History:  Procedure Laterality Date  . ENDOSCOPIC CONCHA BULLOSA RESECTION Right 04/18/2019   Procedure: ENDOSCOPIC CONCHA BULLOSA RESECTION;  Surgeon: Vernie Murders, MD;  Location: [facility];  Service: ENT;  Laterality: Right;  Latex  . SEPTOPLASTY Bilateral 04/18/2019   Procedure: SEPTOPLASTY;  Surgeon: Vernie Murders, MD;  Location: [facility];  Service: ENT;   Laterality: Bilateral;  . TURBINATE REDUCTION Bilateral 04/18/2019   Procedure: Frederik Schmidt REDUCTION;  Surgeon: Vernie Murders, MD;  Location: [facility];  Service: ENT;  Laterality: Bilateral;    Family History  Problem Relation Age of Onset  . Kidney disease Mother   . Heart disease Father     Social History   Tobacco Use  . Smoking status: Never Smoker  . Smokeless tobacco: Never Used  Substance Use Topics  . Alcohol use: Never  . Drug use: Never    There are no problems to display for this patient.   Home Medications:    Current Meds  Medication Sig  . aspirin EC 81 MG tablet Take 81 mg by mouth daily.  . fluticasone (FLONASE) 50 MCG/ACT nasal spray Place into the nose.    Allergies:   Latex and Penicillins  Review of Systems (ROS): Review of Systems  Constitutional: Negative for chills and fever.  Respiratory: Negative for cough and shortness of breath.   Cardiovascular: Positive for leg swelling. Negative for chest pain and palpitations.  Gastrointestinal: Negative for nausea and vomiting.  Skin: Positive for color change. Negative for pallor and rash.  Neurological: Negative for dizziness, syncope, weakness and numbness.  All other systems reviewed and are negative.    Vital Signs: Today's Vitals   05/14/19 1456 05/14/19 1459  BP:  (!) 143/75  Pulse:  85  Resp:  18  Temp:  98.5 F (36.9 C)  TempSrc:  Oral  SpO2:  100%  Weight: 246 lb (111.6 kg)   Height: 5\' 4"  (1.626 m)   PainSc: 9      Physical Exam: Physical  Exam  Constitutional: She is oriented to person, place, and time and well-developed, well-nourished, and in no distress.  HENT:  Head: Normocephalic and atraumatic.  Eyes: Pupils are equal, round, and reactive to light.  Cardiovascular: Normal rate, regular rhythm, normal heart sounds and intact distal pulses.  Pulmonary/Chest: Effort normal and breath sounds normal.  Musculoskeletal:     Cervical back: Normal range of motion and  neck supple.     Right lower leg: Tenderness present. Edema present.     Left lower leg: Tenderness present. Edema present.     Right foot: Swelling and tenderness present.     Left foot: Swelling and tenderness present.     Comments: BLE erythematous with (+) tender edema. Legs are described as "tight" by the patient. Edema and erythema extends from toes to the level of the knee. Pain increases with ambulation. (+) PMS noted distally. Sensation intact. Capillary refill normal. Lower legs and feet significantly warm to touch. Patient has FROM noted BILATERALLY.   Lymphadenopathy:    She has no cervical adenopathy.  Neurological: She is alert and oriented to person, place, and time.  Skin: Skin is warm and dry. No rash noted. She is not diaphoretic.  Psychiatric: Memory, affect and judgment normal. Her mood appears anxious.  Nursing note and vitals reviewed.   Urgent Care Treatments / Results:   Orders Placed This Encounter  Procedures  . US Venous Img Lower Bilateral (DVT)    LABS: PLEASE NOTE: all labs that were ordered this encounter are listed, however only abnormal results are displayed. Labs Reviewed - No data to display  EKG: -None  RADIOLOGY: No results found.  PROCEDURES: Procedures  MEDICATIONS RECEIVED THIS VISIT: Medications - No data to display  PERTINENT CLINICAL COURSE NOTES/UPDATES:   Initial Impression / Assessment and Plan / Urgent Care Course:  Pertinent labs & imaging results that were available during my care of the patient were personally reviewed by me and considered in my medical decision making (see lab/imaging section of note for values and interpretations).  Cassandra Carrillo is a 54 y.o. female who presents to [facility] today with complaints of Leg Swelling (bilateral)  Patient is well appearing overall in clinic today. She does not appear to be in any acute distress. Presenting symptoms (see HPI) and exam as documented above. Discussed  exam findings with patient. Suspect cellulitis, however given her recent reduced mobility following surgery, coupled with her weight, I felt it would be prudent to assess for DVT. Patient has significant pain and swelling in her legs that is exacerbated by ambulation. Lower extremities are well perfused. Discussed that I doubt DVT, however for the aforementioned reasons, it would be ideal to assess the blood flow in her lower extremities to rule out the presence of any thrombus formation. Patient in agreement. Ultrasound not available in the urgent care setting today. Study ordered as outpatient and patient advised to present to [facility] to have her imaging performed today. She advised after appointment was made for her that she was unable to go today, however could go tomorrow. Discussed risks of delaying care in the event that she does indeed have a DVT. Patient knowingly accepts the risks and asks that the appointment be changed until tomorrow; request obliged. Patient advised that the development of any SOB, CP, palpitations, or increased pain in her legs will require emergent evaluation in the emergency department; verbalized understanding.   In efforts to avoid delaying care while awaiting ultrasounds, will send  provisional prescriptions for a steroid taper and a 10 day course of oral doxycycline. Patient to start today. She was advised to rest and elevate her legs, while avoiding any type of leg massage at this point. May use APAP as needed for pain. I will be watching for her ultrasound studies to be interpreted by the radiologist, and will have someone contact patient with the results and further directives regarding treatment if necessary.   Discussed follow up with primary care physician in 1 week for re-evaluation. I have reviewed the follow up and strict return precautions for any new or worsening symptoms. Patient is aware of symptoms that would be deemed urgent/emergent, and would thus require further  evaluation either here or in the emergency department. At the time of discharge, she verbalized understanding and consent with the discharge plan as it was reviewed with her. All questions were fielded by provider and/or clinic staff prior to patient discharge.    Final Clinical Impressions / Urgent Care Diagnoses:   Final diagnoses:  Bilateral lower leg cellulitis  Leg edema  Pain in both lower extremities    New Prescriptions:  [place] Controlled Substance Registry consulted? Not Applicable  Meds ordered this encounter  Medications  . doxycycline (VIBRAMYCIN) 100 MG capsule    Sig: Take 1 capsule (100 mg total) by mouth 2 (two) times daily.    Dispense:  20 capsule    Refill:  0  . predniSONE (STERAPRED UNI-PAK 21 TAB) 10 MG (21) TBPK tablet    Sig: Take by mouth daily. 60 mg x 1 day, 50 mg x 1 day, 40 mg x 1 day, 30 mg x 1 day, 20 mg x 1 day, 10 mg x 1 day    Dispense:  21 tablet    Refill:  0    Recommended Follow up Care:  Patient encouraged to follow up with the following provider within the specified time frame, or sooner as dictated by the severity of her symptoms. As always, she was instructed that for any urgent/emergent care needs, she should seek care either here or in the emergency department for more immediate evaluation.  Follow-up Information    PCP In 1 week.   Why: General reassessment of symptoms if not improving       [facility] On 05/15/2019.   Specialty: Radiology Why: You need to be at the medical mall to check in by 3:30. Contact information: [address] [id number] [address] [place] [address] [phone]        NOTE: This note was prepared using Dragon dictation software along with smaller phrase technology. Despite my best ability to proofread, there is the potential that transcriptional errors may still occur from this process, and are completely unintentional.    Verlee Monte, NP 05/15/19 [phone]

## 2019-12-17 ENCOUNTER — Ambulatory Visit (INDEPENDENT_AMBULATORY_CARE_PROVIDER_SITE_OTHER): Payer: 59

## 2019-12-17 ENCOUNTER — Other Ambulatory Visit: Payer: Self-pay

## 2019-12-17 ENCOUNTER — Ambulatory Visit
Admission: EM | Admit: 2019-12-17 | Discharge: 2019-12-17 | Disposition: A | Discharge status: Home / Self Care | Payer: 59 | Attending: Internal Medicine | Admitting: Internal Medicine

## 2019-12-17 DIAGNOSIS — R609 Edema, unspecified: Secondary | ICD-10-CM

## 2019-12-17 DIAGNOSIS — H60331 Swimmer's ear, right ear: Secondary | ICD-10-CM | POA: Diagnosis present

## 2019-12-17 DIAGNOSIS — K5900 Constipation, unspecified: Secondary | ICD-10-CM

## 2019-12-17 LAB — URINALYSIS, COMPLETE (UACMP) WITH MICROSCOPIC
Bilirubin Urine: NEGATIVE
Glucose, UA: NEGATIVE mg/dL
Hgb urine dipstick: NEGATIVE
Ketones, ur: NEGATIVE mg/dL
Nitrite: NEGATIVE
Protein, ur: NEGATIVE mg/dL
RBC / HPF: NONE SEEN RBC/hpf (ref 0–5)
Specific Gravity, Urine: 1.02 (ref 1.005–1.030)
pH: 7 (ref 5.0–8.0)

## 2019-12-17 MED ORDER — NEOMYCIN-POLYMYXIN-HC 3.5-10000-1 OT SUSP: 3.0000 [drp] | Freq: Three times a day (TID) | OTIC | 0 refills | Status: AC

## 2019-12-17 NOTE — ED Provider Notes (Signed)
[facility]    CSN: [id number] Arrival date & time: 12/17/19  0856      History   Chief Complaint Chief Complaint  Patient presents with  . Otalgia    left  . Edema    HPI Cassandra Carrillo is a 54 y.o. female L ear pain on and off x days. Denies drainage or URI symptoms. Has not been swimming.   2- Four days  developed severe left lower abdominal pain and had hard time having a BM and had sweating during trying to pass BM. After the BM passed she felt it helped some, though she was still sore, the next day had another BM and felt little better. The pain has not returned as bad as it was, but she feels she still have stool she cant pass. Has not had a colonoscopy yet. Sometimes gets constipation off and on. Denies blood in her stool. Denies fever or chills. Her appetite is normal.    3- Has been having intermittent lower legs have with swelling off and on x 1 y. Last years she went to UC and had a work up for DVT which was neg.  Has not seen her PCP since 2007. She is behind getting wellness physical, colonoscopy, pap and mammograms.    Past Medical History:  Diagnosis Date  . Arthritis    fingers  . Vertigo    x1 with sinus infection    There are no problems to display for this patient.   Past Surgical History:  Procedure Laterality Date  . ENDOSCOPIC CONCHA BULLOSA RESECTION Right 04/18/2019   Procedure: ENDOSCOPIC CONCHA BULLOSA RESECTION;  Surgeon: Vernie Murders, MD;  Location: [facility];  Service: ENT;  Laterality: Right;  Latex  . SEPTOPLASTY Bilateral 04/18/2019   Procedure: SEPTOPLASTY;  Surgeon: Vernie Murders, MD;  Location: [facility];  Service: ENT;  Laterality: Bilateral;  . TURBINATE REDUCTION Bilateral 04/18/2019   Procedure: Frederik Schmidt REDUCTION;  Surgeon: Vernie Murders, MD;  Location: [facility];  Service: ENT;  Laterality: Bilateral;    OB History   No obstetric history on file.      Home Medications    Prior to  Admission medications   Medication Sig Start Date End Date Taking? Authorizing Provider  fluticasone (FLONASE) 50 MCG/ACT nasal spray Place into the nose. 04/25/17  Yes [provider]  neomycin-polymyxin-hydrocortisone (CORTISPORIN) 3.5-10000-1 OTIC suspension Place 3 drops into the left ear 3 (three) times daily for 7 days. 12/17/19 12/24/19  Rodriguez-Southworth, Nettie Elm, PA-C  Ferrous Sulfate (IRON PO) Take by mouth daily.  05/14/19  [provider]    Family History Family History  Problem Relation Age of Onset  . Kidney disease Mother   . Heart disease Father     Social History Social History   Tobacco Use  . Smoking status: Never Smoker  . Smokeless tobacco: Never Used  Vaping Use  . Vaping Use: Never used  Substance Use Topics  . Alcohol use: Never  . Drug use: Never     Allergies   Latex and Penicillins   Review of Systems Review of Systems  Constitutional: Negative for appetite change.  HENT: Negative for congestion.   Eyes: Negative for discharge.  Respiratory: Negative for cough and shortness of breath.   Cardiovascular: Positive for leg swelling. Negative for chest pain and palpitations.  Gastrointestinal: Positive for abdominal pain, blood in stool, constipation and rectal pain. Negative for diarrhea, nausea and vomiting.  Genitourinary: Negative for decreased urine  volume, difficulty urinating, dysuria, frequency, pelvic pain and urgency.  Musculoskeletal: Negative for arthralgias, gait problem and myalgias.  Skin: Negative for rash.  Neurological: Positive for headaches. Negative for weakness.     Physical Exam Triage Vital Signs ED Triage Vitals  Enc Vitals Group     BP 12/17/19 0945 130/86     Pulse Rate 12/17/19 0945 63     Resp 12/17/19 0945 14     Temp 12/17/19 0945 98.1 F (36.7 C)     Temp Source 12/17/19 0945 Oral     SpO2 12/17/19 0945 98 %     Weight 12/17/19 0942 235 lb (106.6 kg)     Height 12/17/19 0942 5\' 4"  (1.626 m)      Head Circumference --      Peak Flow --      Pain Score 12/17/19 0942 8     Pain Loc --      Pain Edu? --      Excl. in GC? --    No data found.  Updated Vital Signs BP 130/86 (BP Location: Left Arm)   Pulse 63   Temp 98.1 F (36.7 C) (Oral)   Resp 14   Ht 5\' 4"  (1.626 m)   Wt 235 lb (106.6 kg)   SpO2 98%   BMI 40.34 kg/m   Visual Acuity Right Eye Distance:   Left Eye Distance:   Bilateral Distance:    Right Eye Near:   Left Eye Near:    Bilateral Near:     Physical Exam Constitutional:      General: She is not in acute distress.    Appearance: She is obese. She is not toxic-appearing.  HENT:     Head: Atraumatic.     Right Ear: Tympanic membrane, ear canal and external ear normal.     Left Ear: Tympanic membrane and ear canal normal.     Ears:     Comments: L canal is a little red and very tender when touched with the ear speculum.     Mouth/Throat:     Mouth: Mucous membranes are moist.     Pharynx: Oropharynx is clear.  Eyes:     General: No scleral icterus.    Conjunctiva/sclera: Conjunctivae normal.  Cardiovascular:     Rate and Rhythm: Normal rate and regular rhythm.     Pulses: Normal pulses.     Heart sounds: No murmur heard.   Pulmonary:     Effort: Pulmonary effort is normal.     Breath sounds: Normal breath sounds.  Abdominal:     General: Bowel sounds are normal. There is no distension.     Palpations: Abdomen is soft. There is no mass.     Tenderness: There is no guarding or rebound.     Hernia: No hernia is present.     Comments: Has mild tenderness on LLQ  Genitourinary:    Rectum: Normal.     Comments: RECTAL- unable to get any stool for hemoccult. There were no rectal masses or stool Musculoskeletal:        General: Tenderness present. Normal range of motion.     Right lower leg: Edema present.     Left lower leg: Edema present.     Comments: Has minimal edema of ankles bilaterally, less than +1  Skin:    General: Skin is warm  and dry.     Findings: No rash.  Neurological:     Mental Status: She is alert and  oriented to person, place, and time.     Gait: Gait normal.  Psychiatric:        Mood and Affect: Mood normal.        Behavior: Behavior normal.        Thought Content: Thought content normal.        Judgment: Judgment normal.      UC Treatments / Results  Labs (all labs ordered are listed, but only abnormal results are displayed) Labs Reviewed  URINALYSIS, COMPLETE (UACMP) WITH MICROSCOPIC - Abnormal; Notable for the following components:      Result Value   APPearance HAZY (*)    Leukocytes,Ua TRACE (*)    Bacteria, UA RARE (*)    All other components within normal limits  URINE CULTURE    EKG   Radiology DG Abdomen 1 View  Result Date: 12/17/2019 CLINICAL DATA:  Constipation. EXAM: ABDOMEN - 1 VIEW COMPARISON:  None. FINDINGS: Moderate scattered stool in the colon. No significant stool burden in the rectum or sigmoid colon. No distended small bowel loops to suggest obstruction. The soft tissue shadows are maintained. No worrisome calcifications. The bony structures are intact. IMPRESSION: Moderate scattered stool in the colon. Electronically Signed   By: Rudie Meyer M.D.   On: 12/17/2019 11:10    Procedures Procedures (including critical care time)  Medications Ordered in UC Medications - No data to display  Initial Impression / Assessment and Plan / UC Course  I have reviewed the triage vital signs and the nursing notes. Has early OE, constipation, and mild edema. Encouraged to find a PCP upstairs or via [place] [facility]. Pertinent labs &  imaging results that were available during my care of the patient were reviewed by me and considered in my medical decision making (see chart for details).  Final Clinical Impressions(s) / UC Diagnoses   Final diagnoses:  Acute swimmer's ear of right side  Constipation, unspecified constipation type  1+ pitting edema     Discharge  Instructions     Get a family Dr right away to have a physical and labs done. You also need to get a screening colonoscopy.  Your xray shows you have a lot of stool in your large intestine.  Your urine shows few bacterial and white cells, which could be early infection, so I am sending it for a culture to confirm if there is an infection or not.   To help empty your bowels, get Magnesium Citrate and drink the entire bottle to help empty your bowels  If your abdominal pain comes back come back here  or go to ER.     ED Prescriptions    Medication Sig Dispense Auth. Provider   neomycin-polymyxin-hydrocortisone (CORTISPORIN) 3.5-10000-1 OTIC suspension Place 3 drops into the left ear 3 (three) times daily for 7 days. 10 mL Rodriguez-Southworth, Nettie Elm, PA-C     PDMP not reviewed this encounter.   Garey Ham, PA-C 12/17/19 1234

## 2019-12-17 NOTE — Discharge Instructions (Addendum)
Get a family Dr right away to have a physical and labs done. You also need to get a screening colonoscopy.  Your xray shows you have a lot of stool in your large intestine.  Your urine shows few bacterial and white cells, which could be early infection, so I am sending it for a culture to confirm if there is an infection or not.   To help empty your bowels, get Magnesium Citrate and drink the entire bottle to help empty your bowels  If your abdominal pain comes back come back here  or go to ER.

## 2019-12-17 NOTE — ED Triage Notes (Signed)
Patient states that she has been noticing left ear pain on Sunday and states that she feels like it is swollen.  Patient states that she has been noticing swelling all over body since the beginning of the year. Patient states that she was given prednisone for this before.

## 2019-12-18 LAB — URINE CULTURE
Culture: NO GROWTH
Special Requests: NORMAL

## 2019-12-30 ENCOUNTER — Other Ambulatory Visit: Payer: Self-pay

## 2019-12-30 ENCOUNTER — Ambulatory Visit
Admission: EM | Admit: 2019-12-30 | Discharge: 2019-12-30 | Disposition: A | Discharge status: Home / Self Care | Payer: 59

## 2019-12-30 DIAGNOSIS — H6982 Other specified disorders of Eustachian tube, left ear: Secondary | ICD-10-CM

## 2019-12-30 NOTE — ED Triage Notes (Signed)
Patient complains of left ear tightness and fullness x2 days. Reports the tightness is painful.

## 2019-12-30 NOTE — ED Provider Notes (Signed)
[facility]    CSN: [id number] Arrival date & time: 12/30/19  1053      History   Chief Complaint Chief Complaint  Patient presents with   Ear Fullness   Otalgia    HPI Cassandra Carrillo is a 54 y.o. female.   54 yo female here for evaluation of L ear pain x 2 days and a decrease in hearing on the same side.      Past Medical History:  Diagnosis Date   Arthritis    fingers   Vertigo    x1 with sinus infection    There are no problems to display for this patient.   Past Surgical History:  Procedure Laterality Date   ENDOSCOPIC CONCHA BULLOSA RESECTION Right 04/18/2019   Procedure: ENDOSCOPIC CONCHA BULLOSA RESECTION;  Surgeon: Margaretha Sheffield, MD;  Location: [place];  Service: ENT;  Laterality: Right;  Latex   SEPTOPLASTY Bilateral 04/18/2019   Procedure: SEPTOPLASTY;  Surgeon: Margaretha Sheffield, MD;  Location: [place];  Service: ENT;  Laterality: Bilateral;   TURBINATE REDUCTION Bilateral 04/18/2019   Procedure: TURBINATE REDUCTION;  Surgeon: Margaretha Sheffield, MD;  Location: [place];  Service: ENT;  Laterality: Bilateral;    OB History   No obstetric history on file.      Home Medications    Prior to Admission medications   Medication Sig Start Date End Date Taking? Authorizing Provider  fluticasone (FLONASE) 50 MCG/ACT nasal spray Place into the nose. 04/25/17   [provider]  Ferrous Sulfate (IRON PO) Take by mouth daily.  05/14/19  [provider]    Family History Family History  Problem Relation Age of Onset   Kidney disease Mother    Heart disease Father     Social History Social History   Tobacco Use   Smoking status: Never Smoker   Smokeless tobacco: Never Used  Scientific laboratory technician Use: Never used  Substance Use Topics   Alcohol use: Never   Drug use: Never     Allergies   Latex and Penicillins   Review of Systems Review of Systems  Constitutional: Negative  for activity change, appetite change, fatigue and fever.  HENT: Positive for ear pain, hearing loss, postnasal drip and sinus pressure. Negative for congestion and sore throat.   Respiratory: Positive for cough. Negative for shortness of breath.        In the morning only  Cardiovascular: Negative for chest pain.  Gastrointestinal: Negative for diarrhea, nausea and vomiting.  Musculoskeletal: Negative for arthralgias and myalgias.  Skin: Negative.   Neurological: Negative for facial asymmetry and headaches.  Hematological: Negative.      Physical Exam Triage Vital Signs ED Triage Vitals  Enc Vitals Group     BP 12/30/19 1109 125/71     Pulse Rate 12/30/19 1109 61     Resp 12/30/19 1109 16     Temp 12/30/19 1109 98 F (36.7 C)     Temp src --      SpO2 12/30/19 1109 99 %     Weight --      Height --      Head Circumference --      Peak Flow --      Pain Score 12/30/19 1106 8     Pain Loc --      Pain Edu? --      Excl. in [place]? --    No data found.  Updated Vital Signs BP  Pulse 61    Temp 98 °F (36.7 °C)    Resp 16    SpO2 99%  ° °Visual Acuity °Right Eye Distance:   °Left Eye Distance:   °Bilateral Distance:   ° °Right Eye Near:   °Left Eye Near:    °Bilateral Near:    ° °Physical Exam °Vitals and nursing note reviewed.  °Constitutional:   °   Appearance: Normal appearance. She is obese.  °HENT:  °   Head: Normocephalic and atraumatic.  °   Right Ear: Tympanic membrane, ear canal and external ear normal.  °   Left Ear: External ear normal. A middle ear effusion is present. Tympanic membrane is erythematous.  °   Nose: Rhinorrhea present. Rhinorrhea is clear.  °   Left Turbinates: Swollen.  °   Right Sinus: No maxillary sinus tenderness or frontal sinus tenderness.  °   Left Sinus: Maxillary sinus tenderness present. No frontal sinus tenderness.  °   Mouth/Throat:  °   Mouth: Mucous membranes are moist.  °   Pharynx: Oropharynx is clear.  °   Comments: There is tenderness  when the eustachian tube is milked externally.  °Eyes:  °   Extraocular Movements: Extraocular movements intact.  °   Conjunctiva/sclera: Conjunctivae normal.  °   Pupils: Pupils are equal, round, and reactive to light.  °Cardiovascular:  °   Rate and Rhythm: Normal rate and regular rhythm.  °   Pulses: Normal pulses.  °   Heart sounds: Normal heart sounds.  °Pulmonary:  °   Effort: Pulmonary effort is normal.  °   Breath sounds: Normal breath sounds.  °Musculoskeletal:     °   General: Normal range of motion.  °   Cervical back: Normal range of motion and neck supple.  °Lymphadenopathy:  °   Cervical: No cervical adenopathy.  °Skin: °   General: Skin is warm and dry.  °   Capillary Refill: Capillary refill takes less than 2 seconds.  °   Findings: No rash.  °Neurological:  °   General: No focal deficit present.  °   Mental Status: She is alert and oriented to person, place, and time.  °Psychiatric:     °   Mood and Affect: Mood normal.     °   Thought Content: Thought content normal.     °   Judgment: Judgment normal.  ° ° ° ° °UC Treatments / Results  °Labs °(all labs ordered are listed, but only abnormal results are displayed) °Labs Reviewed - No data to display ° °EKG ° ° °Radiology °No results found. ° °Procedures °Procedures (including critical care time) ° °Medications Ordered in UC °Medications - No data to display ° °Initial Impression / Assessment and Plan / UC Course  °I have reviewed the triage vital signs and the nursing notes. ° °Pertinent labs & imaging results that were available during my care of the patient were reviewed by me and considered in my medical decision making (see chart for details). ° ° Patient is here for evaluation of L ear pain and decreased hearing x 2 days. She was evaluated for similar symptoms on 12/17/19 and diagnosed with swimmers ear and given drops. She is unsure if her symptoms fully resolved. ° °She has a history of chronic sinusitis and had a septoplasty in January. Uses  Flonase daily. ° °Patient has not had a fever or drainage from her ear. There is tenderness when the auricle is manipulated.   Exam reveals serous mid-ear effusion with erythematous TM. There is tenderness when the eustachian tube is manipulated externally. ° °Will treat for Eustachian tube dysfunction and have her follow-up with ENT if symptoms persist.  ° ° °Final Clinical Impressions(s) / UC Diagnoses  ° °Final diagnoses:  °Eustachian tube dysfunction, left  ° ° ° °Discharge Instructions   °  °Continue to use your Flonase daily- when you use the Flonase point the nozzle towards your ear rather than straight back to ensure the medication gets where it needs to go. Follow up each two squirts with a squirt of nasal saline spray so that it pushes the medication further up your nose where it can take effect.  ° °Also irrigate your sinuses twice daily with a Neilmed sinus rinse kit and distilled water. Do not use tap water. This will help clear mucous trapped in your sinuses and also wash out your Eustachian tube which allows your inner ear to drain.  ° °If your symptoms continue you may need to see ENT again so they can look with a scope to see if there is an issue.  ° ° ° °ED Prescriptions   ° None  °  ° °PDMP not reviewed this encounter. °  °Ryan, Jeremy, NP °12/30/19 1156 ° °

## 2019-12-30 NOTE — Discharge Instructions (Addendum)
Continue to use your Flonase daily- when you use the Flonase point the nozzle towards your ear rather than straight back to ensure the medication gets where it needs to go. Follow up each two squirts with a squirt of nasal saline spray so that it pushes the medication further up your nose where it can take effect.   Also irrigate your sinuses twice daily with a Neilmed sinus rinse kit and distilled water. Do not use tap water. This will help clear mucous trapped in your sinuses and also wash out your Eustachian tube which allows your inner ear to drain.   If your symptoms continue you may need to see ENT again so they can look with a scope to see if there is an issue.

## 2020-01-20 ENCOUNTER — Ambulatory Visit: Payer: Self-pay | Admitting: Family Medicine

## 2020-07-24 IMAGING — US US EXTREM LOW VENOUS
1 series · 13 of 24 positions shown · non-contrast
Comparison: None.

CLINICAL DATA: Bilateral lower extremity swelling and pain



[Series 1: us extrem low venous · 13 of 67 slices shown]
[im 1/67]
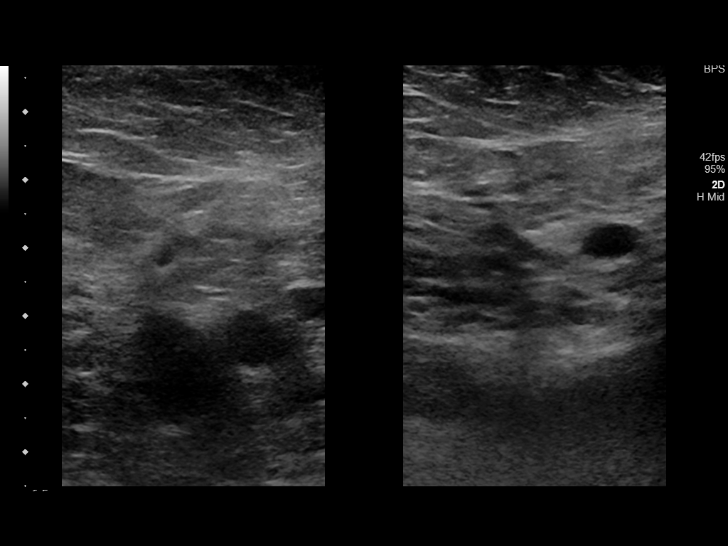
[im 6/67]
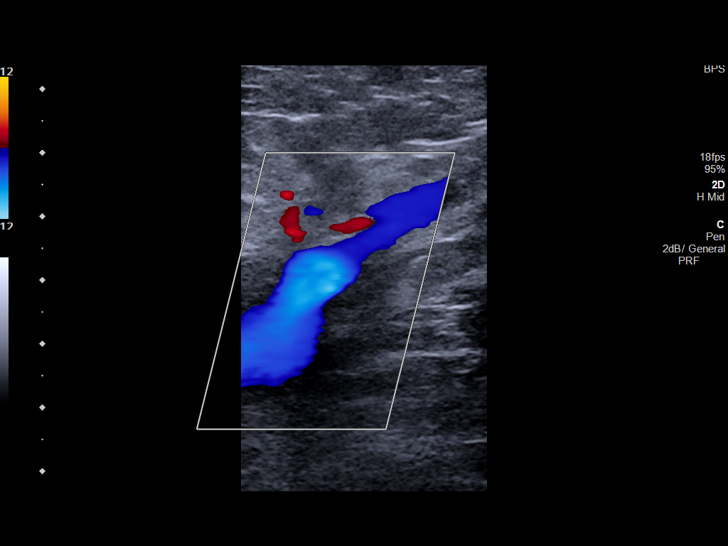
[im 12/67]
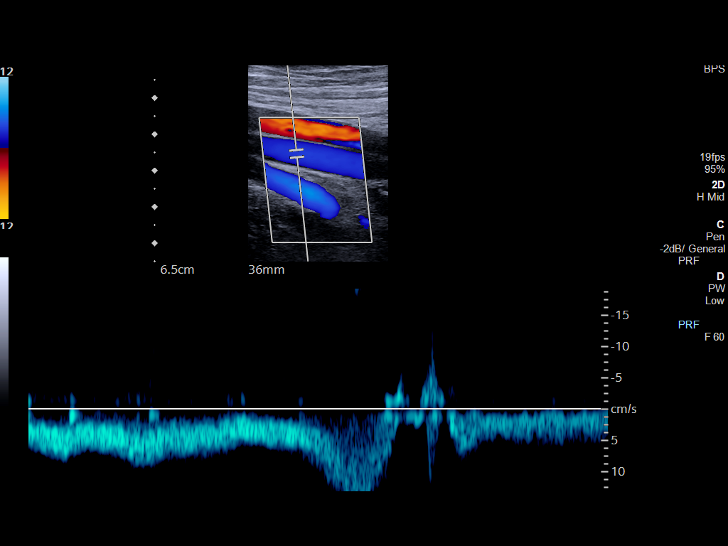
[im 18/67]
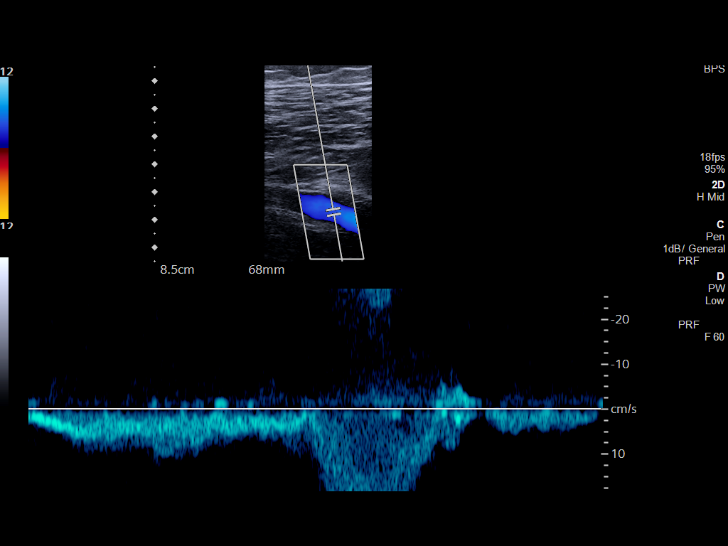
[im 23/67]
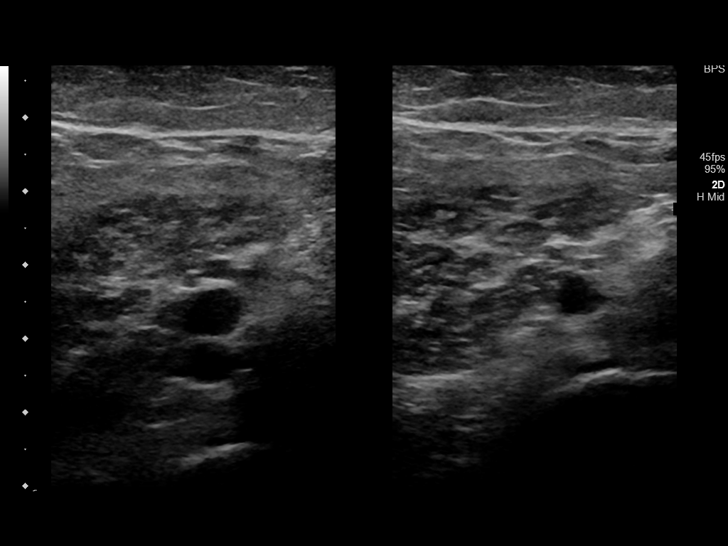
[im 29/67]
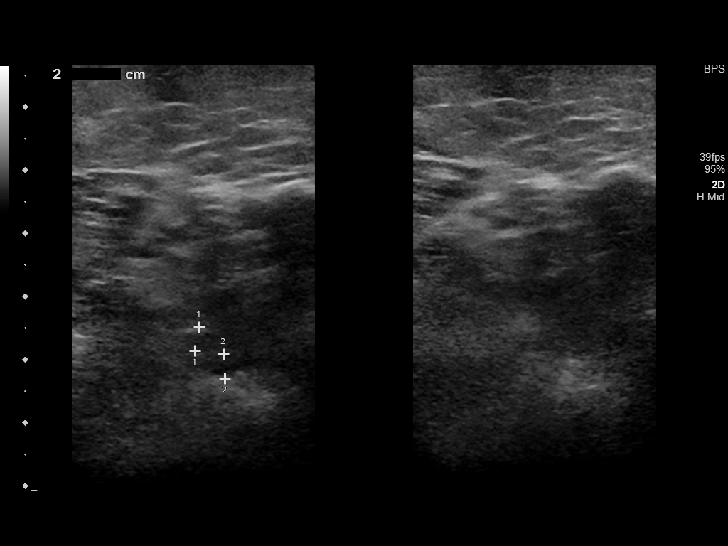
[im 35/67]
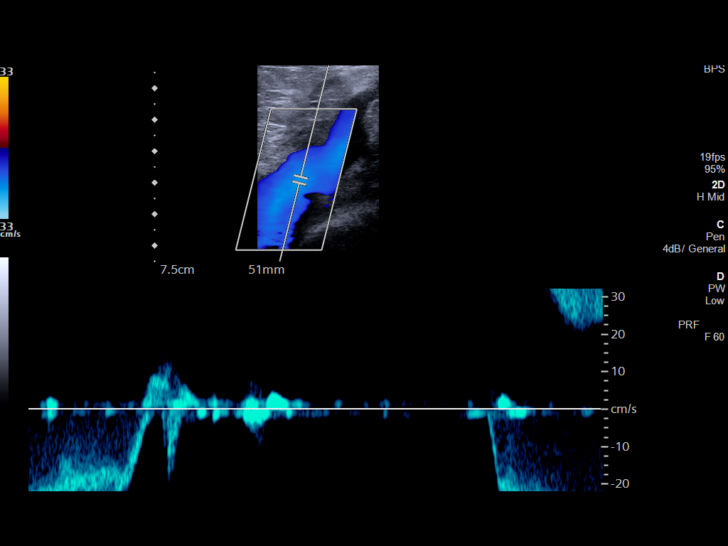
[im 38/67]
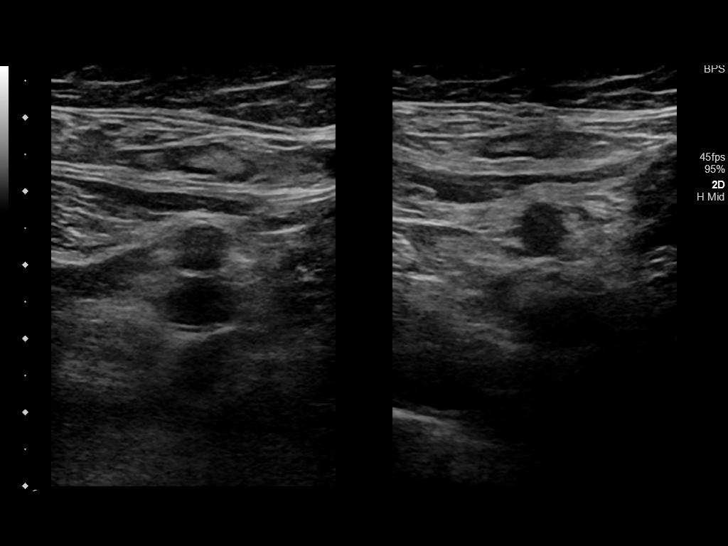
[im 44/67]
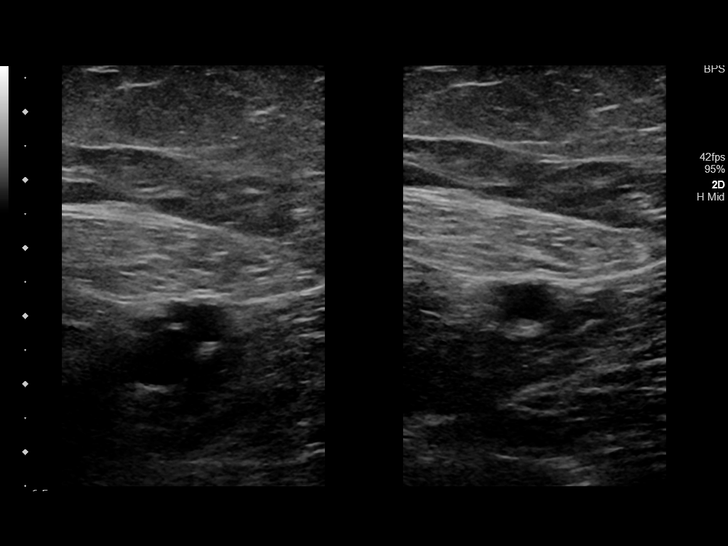
[im 49/67]
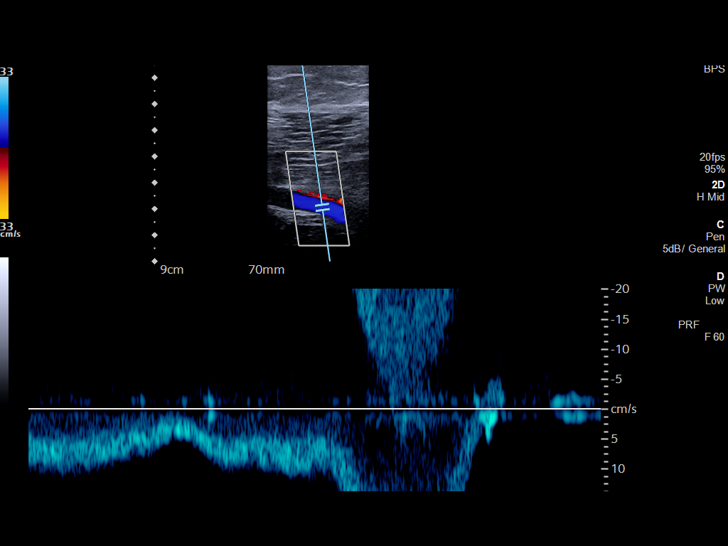
[im 55/67]
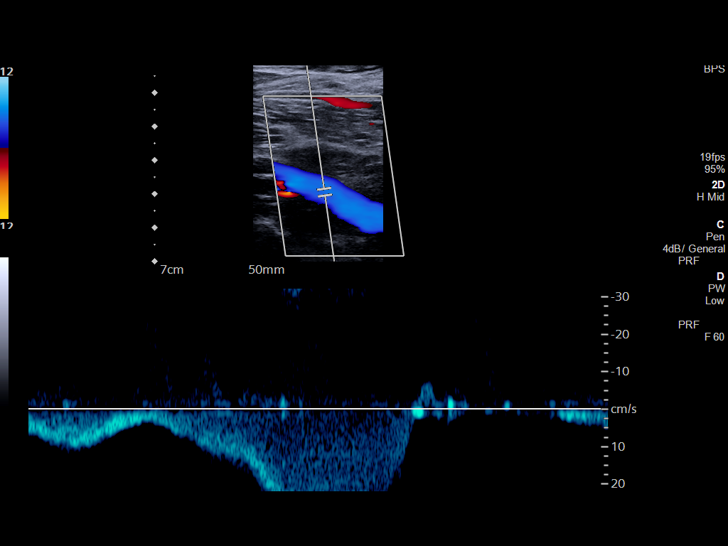
[im 61/67]
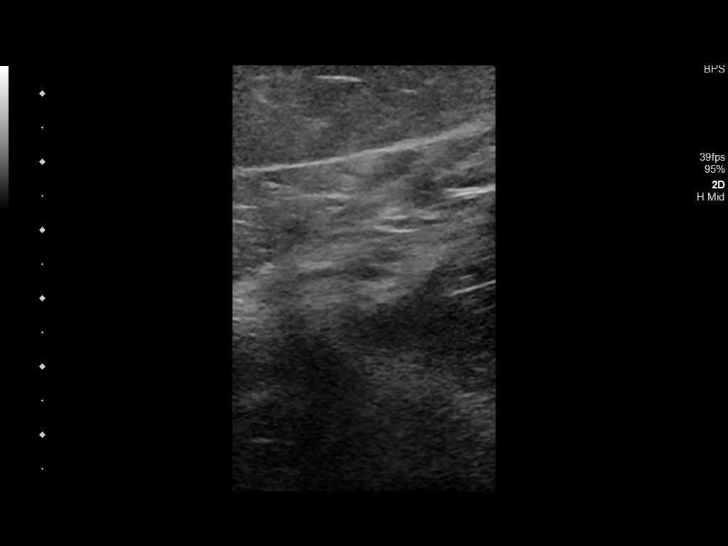
[im 67/67]
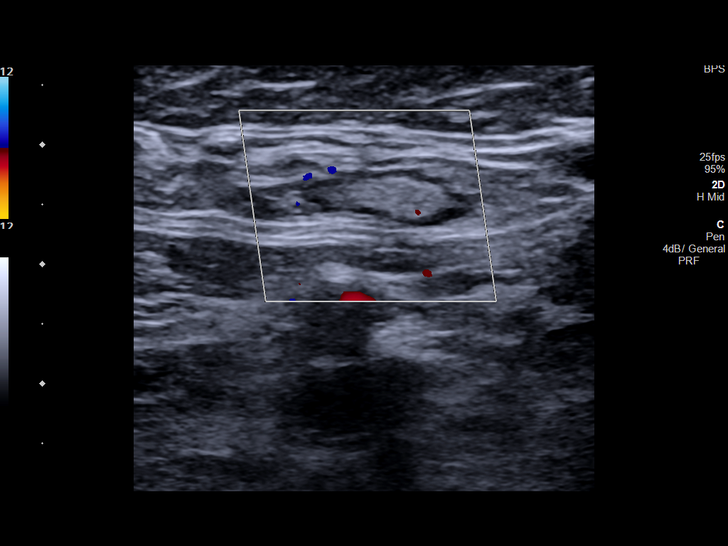

[13 of 24 positions shown; findings below may reference images not displayed]

FINDINGS: RIGHT LOWER EXTREMITY

Common Femoral Vein: No evidence of thrombus. Normal
compressibility, respiratory phasicity and response to augmentation.

Saphenofemoral Junction: No evidence of thrombus. Normal
compressibility and flow on color Doppler imaging.

Profunda Femoral Vein: No evidence of thrombus. Normal
compressibility and flow on color Doppler imaging.

Femoral Vein: No evidence of thrombus. Normal compressibility,
respiratory phasicity and response to augmentation.

Popliteal Vein: No evidence of thrombus. Normal compressibility,
respiratory phasicity and response to augmentation.

Calf Veins: No evidence of thrombus. Normal compressibility and flow
on color Doppler imaging.

LEFT LOWER EXTREMITY

Common Femoral Vein: No evidence of thrombus. Normal
compressibility, respiratory phasicity and response to augmentation.

Saphenofemoral Junction: No evidence of thrombus. Normal
compressibility and flow on color Doppler imaging.

Profunda Femoral Vein: No evidence of thrombus. Normal
compressibility and flow on color Doppler imaging.

Femoral Vein: No evidence of thrombus. Normal compressibility,
respiratory phasicity and response to augmentation.

Popliteal Vein: No evidence of thrombus. Normal compressibility,
respiratory phasicity and response to augmentation.

Calf Veins: No evidence of thrombus. Normal compressibility and flow
on color Doppler imaging.

Elongated fatty replaced lymph nodes present in both inguinal
regions measuring up to 3.25 cm in length but only 0.8 cm in short
axis. These appear benign.
IMPRESSION: No evidence of deep venous thrombosis in either lower extremity.

## 2021-12-29 DIAGNOSIS — M5136 Other intervertebral disc degeneration, lumbar region: Secondary | ICD-10-CM | POA: Diagnosis not present

## 2021-12-29 DIAGNOSIS — M9902 Segmental and somatic dysfunction of thoracic region: Secondary | ICD-10-CM | POA: Diagnosis not present

## 2021-12-29 DIAGNOSIS — M9903 Segmental and somatic dysfunction of lumbar region: Secondary | ICD-10-CM | POA: Diagnosis not present

## 2021-12-29 DIAGNOSIS — M5416 Radiculopathy, lumbar region: Secondary | ICD-10-CM | POA: Diagnosis not present

## 2022-01-04 DIAGNOSIS — M26639 Articular disc disorder of temporomandibular joint, unspecified side: Secondary | ICD-10-CM | POA: Diagnosis not present

## 2022-01-04 DIAGNOSIS — K219 Gastro-esophageal reflux disease without esophagitis: Secondary | ICD-10-CM | POA: Diagnosis not present

## 2022-01-04 DIAGNOSIS — H9202 Otalgia, left ear: Secondary | ICD-10-CM | POA: Diagnosis not present

## 2022-01-04 DIAGNOSIS — J301 Allergic rhinitis due to pollen: Secondary | ICD-10-CM | POA: Diagnosis not present

## 2022-02-24 DIAGNOSIS — M9902 Segmental and somatic dysfunction of thoracic region: Secondary | ICD-10-CM | POA: Diagnosis not present

## 2022-02-24 DIAGNOSIS — M5136 Other intervertebral disc degeneration, lumbar region: Secondary | ICD-10-CM | POA: Diagnosis not present

## 2022-02-24 DIAGNOSIS — M9903 Segmental and somatic dysfunction of lumbar region: Secondary | ICD-10-CM | POA: Diagnosis not present

## 2022-02-24 DIAGNOSIS — M5416 Radiculopathy, lumbar region: Secondary | ICD-10-CM | POA: Diagnosis not present

## 2022-02-25 ENCOUNTER — Encounter: Payer: Self-pay | Admitting: Emergency Medicine

## 2022-02-25 ENCOUNTER — Ambulatory Visit
Admission: EM | Admit: 2022-02-25 | Discharge: 2022-02-25 | Disposition: A | Discharge status: Home / Self Care | Payer: 59 | Attending: Family Medicine | Admitting: Family Medicine

## 2022-02-25 DIAGNOSIS — I872 Venous insufficiency (chronic) (peripheral): Secondary | ICD-10-CM | POA: Diagnosis not present

## 2022-02-25 DIAGNOSIS — R600 Localized edema: Secondary | ICD-10-CM | POA: Insufficient documentation

## 2022-02-25 DIAGNOSIS — H6993 Unspecified Eustachian tube disorder, bilateral: Secondary | ICD-10-CM | POA: Diagnosis not present

## 2022-02-25 LAB — CBC WITH DIFFERENTIAL/PLATELET
Abs Immature Granulocytes: 0.03 10*3/uL (ref 0.00–0.07)
Basophils Absolute: 0.1 10*3/uL (ref 0.0–0.1)
Basophils Relative: 1 %
Eosinophils Absolute: 0.3 10*3/uL (ref 0.0–0.5)
Eosinophils Relative: 4 %
HCT: 43.7 % (ref 36.0–46.0)
Hemoglobin: 14.5 g/dL (ref 12.0–15.0)
Immature Granulocytes: 0 %
Lymphocytes Relative: 23 %
Lymphs Abs: 2 10*3/uL (ref 0.7–4.0)
MCH: 31.7 pg (ref 26.0–34.0)
MCHC: 33.2 g/dL (ref 30.0–36.0)
MCV: 95.6 fL (ref 80.0–100.0)
Monocytes Absolute: 0.6 10*3/uL (ref 0.1–1.0)
Monocytes Relative: 7 %
Neutro Abs: 5.5 10*3/uL (ref 1.7–7.7)
Neutrophils Relative %: 65 %
Platelets: 267 10*3/uL (ref 150–400)
RBC: 4.57 MIL/uL (ref 3.87–5.11)
RDW: 13.5 % (ref 11.5–15.5)
WBC: 8.4 10*3/uL (ref 4.0–10.5)
nRBC: 0 % (ref 0.0–0.2)

## 2022-02-25 LAB — BASIC METABOLIC PANEL
Anion gap: 9 (ref 5–15)
BUN: 11 mg/dL (ref 6–20)
CO2: 23 mmol/L (ref 22–32)
Calcium: 9.6 mg/dL (ref 8.9–10.3)
Chloride: 105 mmol/L (ref 98–111)
Creatinine, Ser: 0.82 mg/dL (ref 0.44–1.00)
GFR, Estimated: >60 mL/min (ref >60)
Glucose, Bld: 81 mg/dL (ref 70–99)
Potassium: 4 mmol/L (ref 3.5–5.1)
Sodium: 137 mmol/L (ref 135–145)

## 2022-02-25 LAB — BRAIN NATRIURETIC PEPTIDE: B Natriuretic Peptide: 41.7 pg/mL (ref 0.0–100.0)

## 2022-02-25 MED ORDER — PREDNISONE 50 MG PO TABS: 50.0000 mg | ORAL_TABLET | Freq: Every day | ORAL | 0 refills | Status: DC

## 2022-02-25 MED ORDER — FUROSEMIDE 20 MG PO TABS: 20.0000 mg | ORAL_TABLET | Freq: Every day | ORAL | 0 refills | Status: AC

## 2022-02-25 MED ORDER — POTASSIUM CHLORIDE 10 MEQ PO PACK: 20.0000 meq | PACK | Freq: Every day | ORAL | 0 refills | Status: AC

## 2022-02-25 NOTE — ED Triage Notes (Signed)
Patient c/o sinus congestion and pressure that started over a week ago. Patient c/o bilateral lower leg swelling that started on November 7.  Patient denies injury or fall.

## 2022-02-25 NOTE — Discharge Instructions (Addendum)
Stop by the pharmacy to pick up your prescriptions.  A referral was placed to cardiology in the next week to discuss getting an echocardiogram.

## 2022-02-25 NOTE — ED Provider Notes (Signed)
[facility]    CSN: [id number] Arrival date & time: 02/25/22  7209      History   Chief Complaint Chief Complaint  Patient presents with   Sinus Problem   Leg Swelling    HPI Cassandra Carrillo is a 56 y.o. female.   HPI   Cassandra Carrillo presents for a sinus problem and bilateral leg swelling.  Bilateral leg swelling and feet since 02/15/22.  She has had leg swelling when she saw her cardiologist, Dr Lady Gary for years ago.  She does not recall getting an echo.  She said that she had a stress test though that that she does not recall the result of.  She may have been told that she had asthma.  Denies chest pain and shortness of breath.  Says that she cannot sleep flat.  Lives in a couch.  She does not put her feet up as she cannot put her feet up and keep her head up in the couch at the same time when she is trying to sleep.  Had sinus surgery in 2021. States she was told to take Aleve and Nasicort which provides her  some relief.  She follows with ENT Dr Penni Bombard.  Has ear fullness and it feels tight. No ear pain or otorrhea.    In the morning she has to cough mucus and blow her nose for about 40 minutes every day.  Has dark yellow mucus.  Says Dr Elenore Rota told her not to lay flat and therefore sleeps upright in the chair.  She had to get a pint of mucus out of her face previously by Dr. Tamsen Snider.  She was told antibiotics would do her no good.       Past Medical History:  Diagnosis Date   Arthritis    fingers   Vertigo    x1 with sinus infection    There are no problems to display for this patient.   Past Surgical History:  Procedure Laterality Date   ENDOSCOPIC CONCHA BULLOSA RESECTION Right 04/18/2019   Procedure: ENDOSCOPIC CONCHA BULLOSA RESECTION;  Surgeon: Vernie Murders, MD;  Location: [facility];  Service: ENT;  Laterality: Right;  Latex   SEPTOPLASTY Bilateral 04/18/2019   Procedure: SEPTOPLASTY;  Surgeon: Vernie Murders, MD;  Location[facility];  Service: ENT;  Laterality: Bilateral;   TURBINATE REDUCTION Bilateral 04/18/2019   Procedure: TURBINATE REDUCTION;  Surgeon: Vernie Murders, MD;  Location: [facility];  Service: ENT;  Laterality: Bilateral;    OB History   No obstetric history on file.      Home Medications    Prior to Admission medications   Medication Sig Start Date End Date Taking? Authorizing Provider  furosemide (LASIX) 20 MG tablet Take 1 tablet (20 mg total) by mouth daily for 3 days. 02/25/22 02/28/22 Yes Iyona Pehrson, DO  Potassium Chloride 10 MEQ PACK Take 20 mEq by mouth daily for 3 days. 02/25/22 02/28/22 Yes Donoven Pett, DO  predniSONE (DELTASONE) 50 MG tablet Take 1 tablet (50 mg total) by mouth daily with breakfast. 02/25/22  Yes Field Staniszewski, DO  fluticasone (FLONASE) 50 MCG/ACT nasal spray Place into the nose. 04/25/17   [provider]  Ferrous Sulfate (IRON PO) Take by mouth daily.  05/14/19  [provider]    Family History Family History  Problem Relation Age of Onset   Kidney disease Mother    Heart disease Father     Social History Social History   Tobacco  Use   Smoking status: Never   Smokeless tobacco: Never  Vaping Use   Vaping Use: Never used  Substance Use Topics   Alcohol use: Never   Drug use: Never     Allergies   Latex and Penicillins   Review of Systems Review of Systems: negative unless otherwise stated in HPI.      Physical Exam Triage Vital Signs ED Triage Vitals  Enc Vitals Group     BP 02/25/22 1002 (!) 139/107     Pulse Rate 02/25/22 1002 74     Resp 02/25/22 1002 14     Temp 02/25/22 1002 98 F (36.7 C)     Temp Source 02/25/22 1002 Oral     SpO2 02/25/22 1002 100 %     Weight 02/25/22 1001 230 lb (104.3 kg)     Height 02/25/22 1001 5\' 4"  (1.626 m)     Head Circumference --      Peak Flow --      Pain Score 02/25/22 1001 8     Pain Loc --      Pain Edu? --      Excl. in [place]? --    No data  found.  Updated Vital Signs BP (!) 139/107 (BP Location: Left Arm)   Pulse 74   Temp 98 F (36.7 C) (Oral)   Resp 14   Ht 5\' 4"  (1.626 m)   Wt 104.3 kg   SpO2 100%   BMI 39.48 kg/m   Visual Acuity Right Eye Distance:   Left Eye Distance:   Bilateral Distance:    Right Eye Near:   Left Eye Near:    Bilateral Near:     Physical Exam GEN:     alert, non-toxic appearing female in no distress    HENT:  mucus membranes moist, no nasal discharge, bilateral TM effusions, normal external auditory canals, no erythema EYES:   pupils equal and reactive, EOMi, no scleral injection NECK:  normal ROM, no lymphadenopathy, no meningismus   RESP:  no increased work of breathing, clear to auscultation bilaterally CVS:   regular rate and rhythm, no JVP, no murmur  MSK: Nontender calves, 2+ pitting edema to the knee, venous stasis dermatitis bilaterally Skin:   warm and dry, no rash on visible skin    UC Treatments / Results  Labs (all labs ordered are listed, but only abnormal results are displayed) Labs Reviewed  CBC WITH DIFFERENTIAL/PLATELET  BASIC METABOLIC PANEL  BRAIN NATRIURETIC PEPTIDE    EKG   Radiology No results found.  Procedures Procedures (including critical care time)  Medications Ordered in UC Medications - No data to display  Initial Impression / Assessment and Plan / UC Course  I have reviewed the triage vital signs and the nursing notes.  Pertinent labs & imaging results that were available during my care of the patient were reviewed by me and considered in my medical decision making (see chart for details).       Pt is a 56 y.o. female who presents for acute on chronic bilateral lower leg swelling and ongoing sinus pressure.  Cassandra Carrillo is afebrile here without recent antipyretics. Satting well on room air. Overall pt is well appearing, well hydrated, without respiratory distress. Pulmonary exam is unremarkable.  She has 3+ bilateral pitting edema with  evidence of venous stasis dermatitis.  This does not appear to be cellulitic.  On chart review, she has history of lower leg swelling going back at least to 2021.  I do not see where she has had an echo.  She reports history of stress testing with Dr. Lady Gary, her cardiologist.  Unfortunately, I am unable to see these notes.  BMP today was <50.  I do not think she has acute heart failure.  This may be lymphedema instead of acute heart failure.  Referral to cardiologist placed as she has not seen a cardiologist in several years.  She has history of recurrent sinusitis and follows with Dr. Genevive Bi, ENT.  Unfortunately on chart review I am unable to see these records.  Reviewed office note from January 2019 by Dr. Wallene Huh at the [place] [facility] for sinusitis.  Given her history and ENT recommendations that were given to her previously no antibiotics were prescribed for eustachian tube dysfunction with recurrent sinusitis.  She was given steroids and advised to continue using her Nasacort.  Aleve for additional pain relief.  Advised patient to follow-up with her ENT provider.   Return and ED precautions given and patient voiced understanding. Discussed MDM, treatment plan and plan for follow-up with patient who agrees with plan.     Final Clinical Impressions(s) / UC Diagnoses   Final diagnoses:  Venous stasis dermatitis of both lower extremities  Bilateral lower extremity edema  Eustachian tube dysfunction, bilateral     Discharge Instructions      Stop by the pharmacy to pick up your prescriptions.  A referral was placed to cardiology in the next week to discuss getting an echocardiogram.      ED Prescriptions     Medication Sig Dispense Auth. Provider   furosemide (LASIX) 20 MG tablet Take 1 tablet (20 mg total) by mouth daily for 3 days. 3 tablet Hillari Zumwalt, DO   Potassium Chloride 10 MEQ PACK Take 20 mEq by mouth daily for 3 days. 6 each Carin Shipp, DO   predniSONE (DELTASONE) 50  MG tablet Take 1 tablet (50 mg total) by mouth daily with breakfast. 5 tablet Lainee Lehrman, DO      PDMP not reviewed this encounter.   Katha Cabal, DO 02/25/22 2309

## 2022-04-08 DIAGNOSIS — R600 Localized edema: Secondary | ICD-10-CM | POA: Diagnosis not present

## 2022-04-08 DIAGNOSIS — Z7689 Persons encountering health services in other specified circumstances: Secondary | ICD-10-CM | POA: Diagnosis not present

## 2022-04-19 DIAGNOSIS — R000 Tachycardia, unspecified: Secondary | ICD-10-CM | POA: Diagnosis not present

## 2022-05-11 DIAGNOSIS — M5136 Other intervertebral disc degeneration, lumbar region: Secondary | ICD-10-CM | POA: Diagnosis not present

## 2022-05-11 DIAGNOSIS — M5416 Radiculopathy, lumbar region: Secondary | ICD-10-CM | POA: Diagnosis not present

## 2022-05-11 DIAGNOSIS — M9903 Segmental and somatic dysfunction of lumbar region: Secondary | ICD-10-CM | POA: Diagnosis not present

## 2022-05-11 DIAGNOSIS — M9902 Segmental and somatic dysfunction of thoracic region: Secondary | ICD-10-CM | POA: Diagnosis not present

## 2022-05-31 DIAGNOSIS — Z6841 Body Mass Index (BMI) 40.0 and over, adult: Secondary | ICD-10-CM | POA: Diagnosis not present

## 2022-05-31 DIAGNOSIS — Z23 Encounter for immunization: Secondary | ICD-10-CM | POA: Diagnosis not present

## 2022-05-31 DIAGNOSIS — R600 Localized edema: Secondary | ICD-10-CM | POA: Diagnosis not present

## 2022-07-21 DIAGNOSIS — Z7689 Persons encountering health services in other specified circumstances: Secondary | ICD-10-CM | POA: Diagnosis not present

## 2022-07-21 DIAGNOSIS — Z23 Encounter for immunization: Secondary | ICD-10-CM | POA: Diagnosis not present

## 2022-07-21 DIAGNOSIS — R600 Localized edema: Secondary | ICD-10-CM | POA: Diagnosis not present

## 2022-07-21 DIAGNOSIS — R42 Dizziness and giddiness: Secondary | ICD-10-CM | POA: Diagnosis not present

## 2022-07-21 DIAGNOSIS — Z6838 Body mass index (BMI) 38.0-38.9, adult: Secondary | ICD-10-CM | POA: Diagnosis not present

## 2022-07-21 DIAGNOSIS — E6609 Other obesity due to excess calories: Secondary | ICD-10-CM | POA: Diagnosis not present

## 2022-08-02 DIAGNOSIS — M5136 Other intervertebral disc degeneration, lumbar region: Secondary | ICD-10-CM | POA: Diagnosis not present

## 2022-08-02 DIAGNOSIS — M5416 Radiculopathy, lumbar region: Secondary | ICD-10-CM | POA: Diagnosis not present

## 2022-08-02 DIAGNOSIS — M9903 Segmental and somatic dysfunction of lumbar region: Secondary | ICD-10-CM | POA: Diagnosis not present

## 2022-08-02 DIAGNOSIS — M9902 Segmental and somatic dysfunction of thoracic region: Secondary | ICD-10-CM | POA: Diagnosis not present

## 2022-09-27 DIAGNOSIS — M5416 Radiculopathy, lumbar region: Secondary | ICD-10-CM | POA: Diagnosis not present

## 2022-09-27 DIAGNOSIS — M9902 Segmental and somatic dysfunction of thoracic region: Secondary | ICD-10-CM | POA: Diagnosis not present

## 2022-09-27 DIAGNOSIS — M5136 Other intervertebral disc degeneration, lumbar region: Secondary | ICD-10-CM | POA: Diagnosis not present

## 2022-09-27 DIAGNOSIS — M9903 Segmental and somatic dysfunction of lumbar region: Secondary | ICD-10-CM | POA: Diagnosis not present

## 2022-09-29 ENCOUNTER — Other Ambulatory Visit: Payer: Self-pay | Admitting: Infectious Diseases

## 2022-09-29 DIAGNOSIS — Z1211 Encounter for screening for malignant neoplasm of colon: Secondary | ICD-10-CM | POA: Diagnosis not present

## 2022-09-29 DIAGNOSIS — E6609 Other obesity due to excess calories: Secondary | ICD-10-CM | POA: Diagnosis not present

## 2022-09-29 DIAGNOSIS — Z6838 Body mass index (BMI) 38.0-38.9, adult: Secondary | ICD-10-CM | POA: Diagnosis not present

## 2022-09-29 DIAGNOSIS — R600 Localized edema: Secondary | ICD-10-CM | POA: Diagnosis not present

## 2022-09-29 DIAGNOSIS — Z0000 Encounter for general adult medical examination without abnormal findings: Secondary | ICD-10-CM | POA: Diagnosis not present

## 2022-09-29 DIAGNOSIS — Z114 Encounter for screening for human immunodeficiency virus [HIV]: Secondary | ICD-10-CM | POA: Diagnosis not present

## 2022-09-29 DIAGNOSIS — Z1231 Encounter for screening mammogram for malignant neoplasm of breast: Secondary | ICD-10-CM

## 2022-09-29 DIAGNOSIS — Z124 Encounter for screening for malignant neoplasm of cervix: Secondary | ICD-10-CM | POA: Diagnosis not present

## 2022-09-29 DIAGNOSIS — Z1212 Encounter for screening for malignant neoplasm of rectum: Secondary | ICD-10-CM | POA: Diagnosis not present

## 2022-10-07 ENCOUNTER — Ambulatory Visit
Admission: RE | Admit: 2022-10-07 | Discharge: 2022-10-07 | Disposition: A | Discharge status: Home / Self Care | Payer: 59 | Source: Ambulatory Visit | Attending: Infectious Diseases | Admitting: Infectious Diseases

## 2022-10-07 DIAGNOSIS — Z1231 Encounter for screening mammogram for malignant neoplasm of breast: Secondary | ICD-10-CM | POA: Diagnosis not present

## 2022-10-17 ENCOUNTER — Other Ambulatory Visit: Payer: Self-pay | Admitting: Family Medicine

## 2022-10-17 DIAGNOSIS — R928 Other abnormal and inconclusive findings on diagnostic imaging of breast: Secondary | ICD-10-CM

## 2022-10-17 DIAGNOSIS — N6489 Other specified disorders of breast: Secondary | ICD-10-CM

## 2022-10-18 DIAGNOSIS — L918 Other hypertrophic disorders of the skin: Secondary | ICD-10-CM | POA: Diagnosis not present

## 2022-10-20 ENCOUNTER — Ambulatory Visit
Admission: RE | Admit: 2022-10-20 | Discharge: 2022-10-20 | Disposition: A | Discharge status: Home / Self Care | Payer: Medicare Other | Source: Ambulatory Visit | Attending: Family Medicine | Admitting: Family Medicine

## 2022-10-20 DIAGNOSIS — R92322 Mammographic fibroglandular density, left breast: Secondary | ICD-10-CM | POA: Diagnosis not present

## 2022-10-20 DIAGNOSIS — N6489 Other specified disorders of breast: Secondary | ICD-10-CM

## 2022-10-20 DIAGNOSIS — R928 Other abnormal and inconclusive findings on diagnostic imaging of breast: Secondary | ICD-10-CM | POA: Insufficient documentation

## 2022-11-01 DIAGNOSIS — N841 Polyp of cervix uteri: Secondary | ICD-10-CM | POA: Diagnosis not present

## 2022-12-06 DIAGNOSIS — M9902 Segmental and somatic dysfunction of thoracic region: Secondary | ICD-10-CM | POA: Diagnosis not present

## 2022-12-06 DIAGNOSIS — M9903 Segmental and somatic dysfunction of lumbar region: Secondary | ICD-10-CM | POA: Diagnosis not present

## 2022-12-06 DIAGNOSIS — M5416 Radiculopathy, lumbar region: Secondary | ICD-10-CM | POA: Diagnosis not present

## 2022-12-06 DIAGNOSIS — M5136 Other intervertebral disc degeneration, lumbar region: Secondary | ICD-10-CM | POA: Diagnosis not present

## 2023-01-19 DIAGNOSIS — Z23 Encounter for immunization: Secondary | ICD-10-CM | POA: Diagnosis not present

## 2023-01-19 DIAGNOSIS — R600 Localized edema: Secondary | ICD-10-CM | POA: Diagnosis not present

## 2023-01-19 DIAGNOSIS — Z6835 Body mass index (BMI) 35.0-35.9, adult: Secondary | ICD-10-CM | POA: Diagnosis not present

## 2023-01-19 DIAGNOSIS — E6609 Other obesity due to excess calories: Secondary | ICD-10-CM | POA: Diagnosis not present

## 2023-01-19 DIAGNOSIS — E66812 Obesity, class 2: Secondary | ICD-10-CM | POA: Diagnosis not present

## 2023-02-08 ENCOUNTER — Encounter: Payer: Self-pay | Admitting: Internal Medicine

## 2023-02-21 ENCOUNTER — Ambulatory Visit: Payer: Medicare HMO | Admitting: Certified Registered Nurse Anesthetist

## 2023-02-21 ENCOUNTER — Encounter: Payer: Self-pay | Admitting: Internal Medicine

## 2023-02-21 ENCOUNTER — Other Ambulatory Visit: Payer: Self-pay

## 2023-02-21 ENCOUNTER — Encounter
Admission: RE | Disposition: A | Discharge status: Home / Self Care | Payer: Self-pay | Source: Home / Self Care | Attending: Internal Medicine

## 2023-02-21 ENCOUNTER — Ambulatory Visit
Admission: RE | Admit: 2023-02-21 | Discharge: 2023-02-21 | Disposition: A | Discharge status: Home / Self Care | Payer: Medicare HMO | Attending: Internal Medicine | Admitting: Internal Medicine

## 2023-02-21 DIAGNOSIS — Z1211 Encounter for screening for malignant neoplasm of colon: Secondary | ICD-10-CM | POA: Insufficient documentation

## 2023-02-21 DIAGNOSIS — Z7952 Long term (current) use of systemic steroids: Secondary | ICD-10-CM | POA: Diagnosis not present

## 2023-02-21 DIAGNOSIS — Z6834 Body mass index (BMI) 34.0-34.9, adult: Secondary | ICD-10-CM | POA: Diagnosis not present

## 2023-02-21 DIAGNOSIS — J45909 Unspecified asthma, uncomplicated: Secondary | ICD-10-CM | POA: Insufficient documentation

## 2023-02-21 DIAGNOSIS — K648 Other hemorrhoids: Secondary | ICD-10-CM | POA: Diagnosis not present

## 2023-02-21 DIAGNOSIS — K635 Polyp of colon: Secondary | ICD-10-CM | POA: Diagnosis not present

## 2023-02-21 DIAGNOSIS — K640 First degree hemorrhoids: Secondary | ICD-10-CM | POA: Diagnosis not present

## 2023-02-21 DIAGNOSIS — D124 Benign neoplasm of descending colon: Secondary | ICD-10-CM | POA: Diagnosis not present

## 2023-02-21 HISTORY — DX: Depression, unspecified: F32.A

## 2023-02-21 HISTORY — DX: Localized edema: R60.0

## 2023-02-21 HISTORY — PX: POLYPECTOMY: SHX5525

## 2023-02-21 HISTORY — PX: COLONOSCOPY WITH PROPOFOL: SHX5780

## 2023-02-21 HISTORY — DX: Unspecified asthma, uncomplicated: J45.909

## 2023-02-21 HISTORY — DX: Anxiety disorder, unspecified: F41.9

## 2023-02-21 SURGERY — COLONOSCOPY WITH PROPOFOL
Anesthesia: General

## 2023-02-21 MED ORDER — SODIUM CHLORIDE 0.9 % IV SOLN: INTRAVENOUS | Status: DC

## 2023-02-21 MED ORDER — PROPOFOL 10 MG/ML IV BOLUS
INTRAVENOUS | Status: AC
  Filled 2023-02-21: qty 80

## 2023-02-21 MED ORDER — PROPOFOL 500 MG/50ML IV EMUL
INTRAVENOUS | Status: DC | PRN
  Administered 2023-02-21: 50 mg via INTRAVENOUS
  Administered 2023-02-21: 100 ug/kg/min via INTRAVENOUS

## 2023-02-21 NOTE — Interval H&P Note (Signed)
History and Physical Interval Note:  02/21/2023 9:33 AM  Cassandra Carrillo  has presented today for surgery, with the diagnosis of V76.51 (ICD-9-CM) - Z12.11 (ICD-10-CM) - Colon cancer screening.  The various methods of treatment have been discussed with the patient and family. After consideration of risks, benefits and other options for treatment, the patient has consented to  Procedure(s): COLONOSCOPY WITH PROPOFOL (N/A) as a surgical intervention.  The patient's history has been reviewed, patient examined, no change in status, stable for surgery.  I have reviewed the patient's chart and labs.  Questions were answered to the patient's satisfaction.     [place], [place]

## 2023-02-21 NOTE — Op Note (Signed)
[facility] Gastroenterology Patient Name: Cassandra Carrillo Procedure Date: 02/21/2023 9:40 AM MRN: [id number] Account #: [id number] Date of Birth: 12-27-1965 Admit Type: Outpatient Age: 57 Room: [facility] ENDO ROOM 1 Gender: Female Note Status: Finalized Instrument Name: Nelda Marseille [id number] Procedure:             Colonoscopy Indications:           Screening for colorectal malignant neoplasm Providers:             Royce Macadamia K. Caton Popowski MD, MD Medicines:             Propofol per Anesthesia Complications:         No immediate complications. Estimated blood loss:                         Minimal. Procedure:             Pre-Anesthesia Assessment:                        - The risks and benefits of the procedure and the                         sedation options and risks were discussed with the                         patient. All questions were answered and informed                         consent was obtained.                        - Patient identification and proposed procedure were                         verified prior to the procedure by the nurse. The                         procedure was verified in the procedure room.                        - ASA Grade Assessment: III - A patient with severe                         systemic disease.                        - After reviewing the risks and benefits, the patient                         was deemed in satisfactory condition to undergo the                         procedure.                        After obtaining informed consent, the colonoscope was                         passed under direct vision. Throughout the procedure,  the patient's blood pressure, pulse, and oxygen                         saturations were monitored continuously. The                         Colonoscope was introduced through the anus and                         advanced to the the cecum, identified by appendiceal                          orifice and ileocecal valve. The colonoscopy was                         performed without difficulty. The patient tolerated                         the procedure well. The quality of the bowel                         preparation was good. The ileocecal valve, appendiceal                         orifice, and rectum were photographed. Findings:      The perianal and digital rectal examinations were normal. Pertinent       negatives include normal sphincter tone and no palpable rectal lesions.      Non-bleeding internal hemorrhoids were found during retroflexion. The       hemorrhoids were Grade I (internal hemorrhoids that do not prolapse).      A 9 mm polyp was found in the descending colon. The polyp was sessile.       The polyp was removed with a cold snare. Resection and retrieval were       complete.      The exam was otherwise without abnormality. Impression:            - Non-bleeding internal hemorrhoids.                        - One 9 mm polyp in the descending colon, removed with                         a cold snare. Resected and retrieved.                        - The examination was otherwise normal. Recommendation:        - Patient has a contact number available for                         emergencies. The signs and symptoms of potential                         delayed complications were discussed with the patient.                         Return to normal activities tomorrow. Written  discharge instructions were provided to the patient.                        - Resume previous diet.                        - Continue present medications.                        - Repeat colonoscopy is recommended for surveillance.                         The colonoscopy date will be determined after                         pathology results from today's exam become available                         for review.                        - Return to GI office PRN.                         - The findings and recommendations were discussed with                         the patient. Procedure Code(s):     --- Professional ---                        [id number], Colonoscopy, flexible; with removal of                         tumor(s), polyp(s), or other lesion(s) by snare                         technique Diagnosis Code(s):     --- Professional ---                        K64.0, First degree hemorrhoids                        D12.4, Benign neoplasm of descending colon                        Z12.11, Encounter for screening for malignant neoplasm                         of colon CPT copyright 2022 American [employer] Association. All rights reserved. The codes documented in this report are preliminary and upon coder review may  be revised to meet current compliance requirements. Stanton Kidney MD, MD 02/21/2023 10:06:33 AM This report has been signed electronically. Number of Addenda: 0 Note Initiated On: 02/21/2023 9:40 AM Scope Withdrawal Time: 0 hours 6 minutes 34 seconds  Total Procedure Duration: 0 hours 9 minutes 20 seconds  Estimated Blood Loss:  Estimated blood loss was minimal.      [facility]

## 2023-02-21 NOTE — Anesthesia Postprocedure Evaluation (Signed)
Anesthesia Post Note  Patient: Cassandra Carrillo  Procedure(s) Performed: COLONOSCOPY WITH PROPOFOL POLYPECTOMY  Patient location during evaluation: Endoscopy Anesthesia Type: General Level of consciousness: awake and alert Pain management: pain level controlled Vital Signs Assessment: post-procedure vital signs reviewed and stable Respiratory status: spontaneous breathing, nonlabored ventilation, respiratory function stable and patient connected to nasal cannula oxygen Cardiovascular status: blood pressure returned to baseline and stable Postop Assessment: no apparent nausea or vomiting Anesthetic complications: no   No notable events documented.   Last Vitals:  Vitals:   02/21/23 1000 02/21/23 1009  BP: 103/64 102/70  Pulse: (!) 52 67  Resp: 17 17  Temp: (!) 36.1 C   SpO2: 98% 99%    Last Pain:  Vitals:   02/21/23 1009  TempSrc:   PainSc: 0-No pain                 Louie Boston

## 2023-02-21 NOTE — H&P (Signed)
Outpatient short stay form Pre-procedure 02/21/2023 9:32 AM Cassandra Carrillo Cassandra Carrillo, M.D.  Primary Physician: Cassandra Carrillo, M.D.  Reason for visit:  Colon cancer screening  History of present illness:  Patient presents for colonoscopy for colon cancer screening. The patient denies complaints of abdominal pain, significant change in bowel habits, or rectal bleeding.      Current Facility-Administered Medications:    0.9 %  sodium chloride infusion, , Intravenous, Continuous, Delta, Cassandra Nearing, MD, Last Rate: 20 mL/hr at 02/21/23 0903, New Bag at 02/21/23 0903  Medications Prior to Admission  Medication Sig Dispense Refill Last Dose   furosemide (LASIX) 20 MG tablet Take 1 tablet (20 mg total) by mouth daily for 3 days. 3 tablet 0 Past Week   naproxen sodium (ALEVE) 220 MG tablet Take 220 mg by mouth 2 (two) times daily as needed.      Potassium Chloride 10 MEQ PACK Take 20 mEq by mouth daily for 3 days. 6 each 0 Past Week   fluticasone (FLONASE) 50 MCG/ACT nasal spray Place into the nose.      predniSONE (DELTASONE) 50 MG tablet Take 1 tablet (50 mg total) by mouth daily with breakfast. (Patient not taking: Reported on 02/17/2023) 5 tablet 0 Completed Course     Allergies  Allergen Reactions   Latex Rash    Gloves, when worn for extended time   Penicillins Rash    Patient took rx amoxicillin for sinusitis in 06/2016 and tolerated without incident.  Has childhood hx rash after ?penicillin injection.     Past Medical History:  Diagnosis Date   Anxiety    Arthritis    fingers   Asthma    Depression    Pedal edema    Vertigo    x1 with sinus infection    Review of systems:  Otherwise negative.    Physical Exam  Gen: Alert, oriented. Appears stated age.  HEENT: [place]/AT. PERRLA. Lungs: CTA, no wheezes. CV: RR nl S1, S2. Abd: soft, benign, no masses. BS+ Ext: No edema. Pulses 2+    Planned procedures: Proceed with colonoscopy. The patient understands the nature of the  planned procedure, indications, risks, alternatives and potential complications including but not limited to bleeding, infection, perforation, damage to internal organs and possible oversedation/side effects from anesthesia. The patient agrees and gives consent to proceed.  Please refer to procedure notes for findings, recommendations and patient disposition/instructions.     Cassandra Carrillo Cassandra Carrillo, M.D. Gastroenterology 02/21/2023  9:32 AM

## 2023-02-21 NOTE — Anesthesia Preprocedure Evaluation (Signed)
Anesthesia Evaluation  Patient identified by MRN, date of birth, ID band Patient awake    Reviewed: Allergy & Precautions, NPO status , Patient's Chart, lab work & pertinent test results  History of Anesthesia Complications Negative for: history of anesthetic complications  Airway Mallampati: III  TM Distance: >3 FB Neck ROM: full    Dental no notable dental hx.    Pulmonary asthma    Pulmonary exam normal        Cardiovascular negative cardio ROS Normal cardiovascular exam     Neuro/Psych  PSYCHIATRIC DISORDERS Anxiety Depression    negative neurological ROS  negative psych ROS   GI/Hepatic negative GI ROS, Neg liver ROS,,,  Endo/Other    Class 3 obesity  Renal/GU negative Renal ROS  negative genitourinary   Musculoskeletal  (+) Arthritis ,    Abdominal   Peds  Hematology negative hematology ROS (+)   Anesthesia Other Findings Past Medical History: No date: Anxiety No date: Arthritis     Comment:  fingers No date: Asthma No date: Depression No date: Pedal edema No date: Vertigo     Comment:  x1 with sinus infection  Past Surgical History: 04/18/2019: ENDOSCOPIC CONCHA BULLOSA RESECTION; Right     Comment:  Procedure: ENDOSCOPIC CONCHA BULLOSA RESECTION;                Surgeon: Vernie Murders, MD;  Location: [facility];  Service: ENT;  Laterality: Right;  Latex 04/18/2019: SEPTOPLASTY; Bilateral     Comment:  Procedure: SEPTOPLASTY;  Surgeon: Vernie Murders, MD;                Location: [facility];  Service: ENT;                Laterality: Bilateral; 04/18/2019: TURBINATE REDUCTION; Bilateral     Comment:  Procedure: TURBINATE REDUCTION;  Surgeon: Vernie Murders,              MD;  Location: [facility];  Service: ENT;                Laterality: Bilateral;     Reproductive/Obstetrics negative OB ROS                             Anesthesia  Physical Anesthesia Plan  ASA: 3  Anesthesia Plan: General   Post-op Pain Management:    Induction: Intravenous  PONV Risk Score and Plan: Propofol infusion and TIVA  Airway Management Planned: Natural Airway and Nasal Cannula  Additional Equipment:   Intra-op Plan:   Post-operative Plan:   Informed Consent: I have reviewed the patients History and Physical, chart, labs and discussed the procedure including the risks, benefits and alternatives for the proposed anesthesia with the patient or authorized representative who has indicated his/her understanding and acceptance.     Dental Advisory Given  Plan Discussed with: Anesthesiologist, CRNA and Surgeon  Anesthesia Plan Comments: (Patient consented for risks of anesthesia including but not limited to:  - adverse reactions to medications - risk of airway placement if required - damage to eyes, teeth, lips or other oral mucosa - nerve damage due to positioning  - sore throat or hoarseness - Damage to heart, brain, nerves, lungs, other parts of body or loss of life  Patient voiced understanding and assent.)       Anesthesia Quick Evaluation

## 2023-02-21 NOTE — Transfer of Care (Signed)
Immediate Anesthesia Transfer of Care Note  Patient: Cassandra Carrillo  Procedure(s) Performed: COLONOSCOPY WITH PROPOFOL POLYPECTOMY  Patient Location: PACU and Endoscopy Unit  Anesthesia Type:General  Level of Consciousness: awake, drowsy, and patient cooperative  Airway & Oxygen Therapy: Patient Spontanous Breathing  Post-op Assessment: Report given to RN, Post -op Vital signs reviewed and stable, and Patient moving all extremities  Post vital signs: Reviewed and stable  Last Vitals:  Vitals Value Taken Time  BP 103/64 02/21/23 1000  Temp 36.1 C 02/21/23 1000  Pulse 52 02/21/23 1000  Resp 17 02/21/23 1000  SpO2 98 % 02/21/23 1000    Last Pain:  Vitals:   02/21/23 1000  TempSrc: Temporal  PainSc: Asleep         Complications: No notable events documented.

## 2023-02-22 ENCOUNTER — Encounter: Payer: Self-pay | Admitting: Internal Medicine

## 2023-02-22 LAB — SURGICAL PATHOLOGY

## 2023-03-30 DIAGNOSIS — R928 Other abnormal and inconclusive findings on diagnostic imaging of breast: Secondary | ICD-10-CM | POA: Diagnosis not present

## 2023-03-30 DIAGNOSIS — E669 Obesity, unspecified: Secondary | ICD-10-CM | POA: Diagnosis not present

## 2023-03-30 DIAGNOSIS — R600 Localized edema: Secondary | ICD-10-CM | POA: Diagnosis not present

## 2023-03-30 DIAGNOSIS — Z6833 Body mass index (BMI) 33.0-33.9, adult: Secondary | ICD-10-CM | POA: Diagnosis not present

## 2023-04-03 ENCOUNTER — Other Ambulatory Visit: Payer: Self-pay | Admitting: Infectious Diseases

## 2023-04-03 DIAGNOSIS — R928 Other abnormal and inconclusive findings on diagnostic imaging of breast: Secondary | ICD-10-CM

## 2023-04-12 DIAGNOSIS — N649 Disorder of breast, unspecified: Secondary | ICD-10-CM

## 2023-04-12 HISTORY — DX: Disorder of breast, unspecified: N64.9

## 2023-04-26 ENCOUNTER — Ambulatory Visit
Admission: RE | Admit: 2023-04-26 | Discharge: 2023-04-26 | Disposition: A | Discharge status: Home / Self Care | Payer: Medicare HMO | Source: Ambulatory Visit | Attending: Infectious Diseases | Admitting: Infectious Diseases

## 2023-04-26 DIAGNOSIS — R928 Other abnormal and inconclusive findings on diagnostic imaging of breast: Secondary | ICD-10-CM | POA: Diagnosis not present

## 2023-04-26 DIAGNOSIS — R92322 Mammographic fibroglandular density, left breast: Secondary | ICD-10-CM | POA: Diagnosis not present

## 2023-04-26 DIAGNOSIS — R921 Mammographic calcification found on diagnostic imaging of breast: Secondary | ICD-10-CM | POA: Diagnosis not present

## 2023-05-01 ENCOUNTER — Other Ambulatory Visit: Payer: Self-pay | Admitting: Infectious Diseases

## 2023-05-01 DIAGNOSIS — N6489 Other specified disorders of breast: Secondary | ICD-10-CM

## 2023-05-09 ENCOUNTER — Ambulatory Visit
Admission: RE | Admit: 2023-05-09 | Discharge: 2023-05-09 | Disposition: A | Discharge status: Home / Self Care | Payer: Medicare HMO | Source: Ambulatory Visit | Attending: Infectious Diseases | Admitting: Infectious Diseases

## 2023-05-09 DIAGNOSIS — N6325 Unspecified lump in the left breast, overlapping quadrants: Secondary | ICD-10-CM | POA: Diagnosis not present

## 2023-05-09 DIAGNOSIS — N6489 Other specified disorders of breast: Secondary | ICD-10-CM | POA: Diagnosis not present

## 2023-05-09 DIAGNOSIS — R928 Other abnormal and inconclusive findings on diagnostic imaging of breast: Secondary | ICD-10-CM | POA: Diagnosis not present

## 2023-05-09 HISTORY — PX: BREAST BIOPSY: SHX20

## 2023-05-09 MED ORDER — LIDOCAINE-EPINEPHRINE 1 %-1:100000 IJ SOLN
10.0000 mL | Freq: Once | INTRAMUSCULAR | Status: AC
  Administered 2023-05-09: 10 mL

## 2023-05-09 MED ORDER — LIDOCAINE 1 % OPTIME INJ - NO CHARGE
5.0000 mL | Freq: Once | INTRAMUSCULAR | Status: AC
  Administered 2023-05-09: 5 mL
  Filled 2023-05-09: qty 6

## 2023-05-09 MED ORDER — LIDOCAINE HCL 1 % IJ SOLN
10.0000 mL | Freq: Once | INTRAMUSCULAR | Status: AC
  Administered 2023-05-09: 10 mL

## 2023-05-10 LAB — SURGICAL PATHOLOGY

## 2023-05-11 ENCOUNTER — Encounter: Payer: Self-pay | Admitting: *Deleted

## 2023-05-11 NOTE — Progress Notes (Signed)
Referral recieved from [facility] Radiology for benign breast mass. She will see Dr. Maia Plan on 2/18 at 9:45

## 2023-06-07 DIAGNOSIS — M5416 Radiculopathy, lumbar region: Secondary | ICD-10-CM | POA: Diagnosis not present

## 2023-06-07 DIAGNOSIS — M9902 Segmental and somatic dysfunction of thoracic region: Secondary | ICD-10-CM | POA: Diagnosis not present

## 2023-06-07 DIAGNOSIS — M5136 Other intervertebral disc degeneration, lumbar region with discogenic back pain only: Secondary | ICD-10-CM | POA: Diagnosis not present

## 2023-06-07 DIAGNOSIS — M9903 Segmental and somatic dysfunction of lumbar region: Secondary | ICD-10-CM | POA: Diagnosis not present

## 2023-06-13 ENCOUNTER — Ambulatory Visit: Payer: Self-pay | Admitting: General Surgery

## 2023-06-13 ENCOUNTER — Other Ambulatory Visit: Payer: Self-pay | Admitting: General Surgery

## 2023-06-13 DIAGNOSIS — N6489 Other specified disorders of breast: Secondary | ICD-10-CM | POA: Diagnosis not present

## 2023-06-13 NOTE — H&P (Signed)
 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   She underwent a screening mammogram on October 06, 2032, which showed a possible asymmetry in the left breast. A follow-up diagnostic mammogram on October 19, 2032, revealed a cluster of dilated duct versus cysts, leading to a recommendation for short-term imaging in six months. A subsequent mammogram on April 25, 2033, showed an intermediate left breast focal asymmetry, which was the same size as six months prior but more prominent, prompting a recommendation for a biopsy. She had a stereotactic core biopsy on May 09, 2023, which revealed a complex sclerosing lesion with focal epithelial atypia, concordant with imaging findings. She reports that the adhesive strips from the biopsy have not yet fallen off. She experiences pain in the left breast, particularly at night, which she sometimes alleviates by rubbing the area.  She experienced a syncope attack during the biopsy procedure, which she attributes to her fear of needles.  She has a family history of breast cancer; her father's sister, Delray Alt, had breast cancer, underwent a mastectomy, and eventually died from the disease. No other family history of cancer beyond her aunt's breast cancer.  She reports a history of heavy menstrual bleeding and severe pain during her periods, for which she previously used birth control pills for pain management. She has no history of hormone therapy post-menopause, pregnancies, or other biopsies. She mentions having sinus surgery in the past and experiencing pulmonary issues. She also notes swelling in her legs, which she associates with heart issues, and a history of a head injury at age [age], which she believes affected her speech.       PAST MEDICAL HISTORY:  Past Medical History:  Diagnosis Date   Anxiety    Asthma, unspecified asthma  severity, unspecified whether complicated, unspecified whether persistent (HHS-HCC)    Depression         PAST SURGICAL HISTORY:   Past Surgical History:  Procedure Laterality Date   Colon @ [facility]  02/21/2023   Tubular adenomas/Repeat 30yrs/TKT   FUNCTIONAL ENDOSCOPIC SINUS SURGERY     SEPTOPLASTY           MEDICATIONS:  Outpatient Encounter Medications as of 06/13/2023  Medication Sig Dispense Refill   FUROsemide (LASIX) 20 MG tablet TAKE 1 TABLET (20 MG TOTAL) BY MOUTH ONCE DAILY AS NEEDED (FOR LEG SWELLING) 90 tablet 1   naproxen sodium (ALEVE) 220 MG tablet Take 220 mg by mouth 2 (two) times daily as needed for Pain     triamcinolone (NASACORT AQ) 55 mcg nasal spray Place 2 sprays into both nostrils once daily     sodium, potassium, and magnesium (SUPREP) oral solution Take 1 Bottle by mouth as directed One kit contains 2 bottles.  Take both bottles at the times instructed by your provider. (Patient not taking: Reported on 06/13/2023) 354 mL 0   No facility-administered encounter medications on file as of 06/13/2023.     ALLERGIES:   Penicillin and Latex   SOCIAL HISTORY:  Social History   Socioeconomic History   Marital status: Single  Tobacco Use   Smoking status: Never   Smokeless tobacco: Never  Vaping Use   Vaping status: Never Used  Substance and Sexual Activity   Alcohol use: Not Currently   Drug use: Never   Sexual activity: Not Currently    Birth control/protection: Post-menopausal   Social Drivers of  Health   Financial Resource Strain: Medium Risk (06/13/2023)   Overall Financial Resource Strain (CARDIA)    Difficulty of Paying Living Expenses: Somewhat hard  Food Insecurity: Food Insecurity Present (06/13/2023)   Hunger Vital Sign    Worried About Running Out of Food in the Last Year: Sometimes true    Ran Out of Food in the Last Year: Never true  Transportation Needs: No Transportation Needs (06/13/2023)   PRAPARE - Administrator, Civil Service  (Medical): No    Lack of Transportation (Non-Medical): No    FAMILY HISTORY:  Family History  Problem Relation Name Age of Onset   Kidney disease Mother     Heart disease Father     COPD Father       GENERAL REVIEW OF SYSTEMS:   General ROS: negative for - chills, fatigue, fever, weight gain or weight loss Allergy and Immunology ROS: negative for - hives  Hematological and Lymphatic ROS: negative for - bleeding problems or bruising, negative for palpable nodes Endocrine ROS: negative for - heat or cold intolerance, hair changes Respiratory ROS: negative for - cough, shortness of breath or wheezing Cardiovascular ROS: no chest pain or palpitations.  Positive for leg swelling GI ROS: negative for nausea, vomiting, abdominal pain, diarrhea, constipation Musculoskeletal ROS: Positive for - joint swelling or muscle pain Neurological ROS: negative for - confusion, syncope Dermatological ROS: negative for pruritus and rash  PHYSICAL EXAM:  Vitals:   06/13/23 0930  BP: 114/76  Pulse: 64  .  Ht:162.6 cm (5\' 4" ) Wt:89.4 kg (197 lb) ZOX:WRUE surface area is 2.01 meters squared. Body mass index is 33.81 kg/m.Marland Kitchen   GENERAL: Alert, active, oriented x3  HEENT: Pupils equal reactive to light. Extraocular movements are intact. Sclera clear. Palpebral conjunctiva normal red color.Pharynx clear.  NECK: Supple with no palpable mass and no adenopathy.  LUNGS: Sound clear with no rales rhonchi or wheezes.  HEART: Regular rhythm S1 and S2 without murmur.  BREAST: Both breast examined in the sitting and supine position.  No palpable masses, skin changes, nipple retraction or nipple discharge.  EXTREMITIES: Well-developed well-nourished symmetrical with no dependent edema.  NEUROLOGICAL: Awake alert oriented, facial expression symmetrical, moving all extremities.   Results RADIOLOGY (I personally evaluated this images) Screening mammogram: Possible asymmetry of the left breast  (10/07/2022) Diagnostic mammogram: Cluster of dielectric duct vs. 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 The lesion is concordant with imaging findings and is considered precancerous due to atypia, indicating abnormal cellular changes. There is a low risk that the biopsy may have missed an existing cancer, and if untreated, the lesion could progress to cancer. The lesion measures approximately 3 cm, which is large for an asymmetry, though not entirely composed of atypical cells. Her family history of breast cancer may increase her risk. An excisional biopsy is recommended to ensure no current cancer and prevent future malignancy. Schedule an excisional biopsy of the left breast to remove the lesion. Perform preoperative localization with a tag to guide excision. Conduct the procedure under general anesthesia in the operating room. Send  excised tissue to pathology for analysis to confirm the absence of cancer and prevent future malignancy. Advise her to wear a tight or sports bra postoperatively to reduce fluid accumulation. Prescribe pain medication as needed, with the option to use acetaminophen or ibuprofen. Instruct her to avoid eating or drinking after midnight before the surgery. Ensure she has someone to accompany her on the day of surgery and stay with her for 24 hours postoperatively.   Complex sclerosing lesion of left breast [N64.89]          Patient verbalized understanding, all questions were answered, and  were agreeable with the plan outlined above.   Carolan Shiver, MD  Electronically signed by Carolan Shiver, MD

## 2023-06-13 NOTE — H&P (View-Only) (Signed)
 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   She underwent a screening mammogram on October 06, 2032, which showed a possible asymmetry in the left breast. A follow-up diagnostic mammogram on October 19, 2032, revealed a cluster of dilated duct versus cysts, leading to a recommendation for short-term imaging in six months. A subsequent mammogram on April 25, 2033, showed an intermediate left breast focal asymmetry, which was the same size as six months prior but more prominent, prompting a recommendation for a biopsy. She had a stereotactic core biopsy on May 09, 2023, which revealed a complex sclerosing lesion with focal epithelial atypia, concordant with imaging findings. She reports that the adhesive strips from the biopsy have not yet fallen off. She experiences pain in the left breast, particularly at night, which she sometimes alleviates by rubbing the area.  She experienced a syncope attack during the biopsy procedure, which she attributes to her fear of needles.  She has a family history of breast cancer; her father's sister, Delray Alt, had breast cancer, underwent a mastectomy, and eventually died from the disease. No other family history of cancer beyond her aunt's breast cancer.  She reports a history of heavy menstrual bleeding and severe pain during her periods, for which she previously used birth control pills for pain management. She has no history of hormone therapy post-menopause, pregnancies, or other biopsies. She mentions having sinus surgery in the past and experiencing pulmonary issues. She also notes swelling in her legs, which she associates with heart issues, and a history of a head injury at age [age], which she believes affected her speech.       PAST MEDICAL HISTORY:  Past Medical History:  Diagnosis Date   Anxiety    Asthma, unspecified asthma  severity, unspecified whether complicated, unspecified whether persistent ([facility]HS-HCC)    Depression         PAST SURGICAL HISTORY:   Past Surgical History:  Procedure Laterality Date   Colon @ [facility]  02/21/2023   Tubular adenomas/Repeat 30yrs/TKT   FUNCTIONAL ENDOSCOPIC SINUS SURGERY     SEPTOPLASTY           MEDICATIONS:  Outpatient Encounter Medications as of 06/13/2023  Medication Sig Dispense Refill   FUROsemide (LASIX) 20 MG tablet TAKE 1 TABLET (20 MG TOTAL) BY MOUTH ONCE DAILY AS NEEDED (FOR LEG SWELLING) 90 tablet 1   naproxen sodium (ALEVE) 220 MG tablet Take 220 mg by mouth 2 (two) times daily as needed for Pain     triamcinolone (NASACORT AQ) 55 mcg nasal spray Place 2 sprays into both nostrils once daily     sodium, potassium, and magnesium (SUPREP) oral solution Take 1 Bottle by mouth as directed One kit contains 2 bottles.  Take both bottles at the times instructed by your provider. (Patient not taking: Reported on 06/13/2023) 354 mL 0   No facility-administered encounter medications on file as of 06/13/2023.     ALLERGIES:   Penicillin and Latex   SOCIAL HISTORY:  Social History   Socioeconomic History   Marital status: Single  Tobacco Use   Smoking status: Never   Smokeless tobacco: Never  Vaping Use   Vaping status: Never Used  Substance and Sexual Activity   Alcohol use: Not Currently   Drug use: Never   Sexual activity: Not Currently    Birth control/protection: Post-menopausal   Social Drivers of  Health   Financial Resource Strain: Medium Risk (06/13/2023)   Overall Financial Resource Strain (CARDIA)    Difficulty of Paying Living Expenses: Somewhat hard  Food Insecurity: Food Insecurity Present (06/13/2023)   Hunger Vital Sign    Worried About Running Out of Food in the Last Year: Sometimes true    Ran Out of Food in the Last Year: Never true  Transportation Needs: No Transportation Needs (06/13/2023)   PRAPARE - Administrator, Civil Service  (Medical): No    Lack of Transportation (Non-Medical): No    FAMILY HISTORY:  Family History  Problem Relation Name Age of Onset   Kidney disease Mother     Heart disease Father     COPD Father       GENERAL REVIEW OF SYSTEMS:   General ROS: negative for - chills, fatigue, fever, weight gain or weight loss Allergy and Immunology ROS: negative for - hives  Hematological and Lymphatic ROS: negative for - bleeding problems or bruising, negative for palpable nodes Endocrine ROS: negative for - heat or cold intolerance, hair changes Respiratory ROS: negative for - cough, shortness of breath or wheezing Cardiovascular ROS: no chest pain or palpitations.  Positive for leg swelling GI ROS: negative for nausea, vomiting, abdominal pain, diarrhea, constipation Musculoskeletal ROS: Positive for - joint swelling or muscle pain Neurological ROS: negative for - confusion, syncope Dermatological ROS: negative for pruritus and rash  PHYSICAL EXAM:  Vitals:   06/13/23 0930  BP: 114/76  Pulse: 64  .  Ht:162.6 cm (5\' 4" ) Wt:89.4 kg (197 lb) ZOX:WRUE surface area is 2.01 meters squared. Body mass index is 33.81 kg/m.Marland Kitchen   GENERAL: Alert, active, oriented x3  HEENT: Pupils equal reactive to light. Extraocular movements are intact. Sclera clear. Palpebral conjunctiva normal red color.Pharynx clear.  NECK: Supple with no palpable mass and no adenopathy.  LUNGS: Sound clear with no rales rhonchi or wheezes.  HEART: Regular rhythm S1 and S2 without murmur.  BREAST: Both breast examined in the sitting and supine position.  No palpable masses, skin changes, nipple retraction or nipple discharge.  EXTREMITIES: Well-developed well-nourished symmetrical with no dependent edema.  NEUROLOGICAL: Awake alert oriented, facial expression symmetrical, moving all extremities.   Results RADIOLOGY (I personally evaluated this images) Screening mammogram: Possible asymmetry of the left breast  (10/07/2022) Diagnostic mammogram: Cluster of dielectric duct vs. 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 The lesion is concordant with imaging findings and is considered precancerous due to atypia, indicating abnormal cellular changes. There is a low risk that the biopsy may have missed an existing cancer, and if untreated, the lesion could progress to cancer. The lesion measures approximately 3 cm, which is large for an asymmetry, though not entirely composed of atypical cells. Her family history of breast cancer may increase her risk. An excisional biopsy is recommended to ensure no current cancer and prevent future malignancy. Schedule an excisional biopsy of the left breast to remove the lesion. Perform preoperative localization with a tag to guide excision. Conduct the procedure under general anesthesia in the operating room. Send  excised tissue to pathology for analysis to confirm the absence of cancer and prevent future malignancy. Advise her to wear a tight or sports bra postoperatively to reduce fluid accumulation. Prescribe pain medication as needed, with the option to use acetaminophen or ibuprofen. Instruct her to avoid eating or drinking after midnight before the surgery. Ensure she has someone to accompany her on the day of surgery and stay with her for 24 hours postoperatively.   Complex sclerosing lesion of left breast [N64.89]          Patient verbalized understanding, all questions were answered, and  were agreeable with the plan outlined above.   Carolan Shiver, MD  Electronically signed by Carolan Shiver, MD

## 2023-06-16 ENCOUNTER — Encounter: Payer: Self-pay | Admitting: General Surgery

## 2023-06-16 ENCOUNTER — Other Ambulatory Visit: Payer: Self-pay

## 2023-06-16 ENCOUNTER — Encounter
Admission: RE | Admit: 2023-06-16 | Discharge: 2023-06-16 | Disposition: A | Discharge status: Home / Self Care | Source: Ambulatory Visit | Attending: General Surgery | Admitting: General Surgery

## 2023-06-16 VITALS — Ht 64.0 in | Wt 197.0 lb

## 2023-06-16 DIAGNOSIS — R079 Chest pain, unspecified: Secondary | ICD-10-CM

## 2023-06-16 DIAGNOSIS — Z01812 Encounter for preprocedural laboratory examination: Secondary | ICD-10-CM

## 2023-06-16 DIAGNOSIS — N649 Disorder of breast, unspecified: Secondary | ICD-10-CM

## 2023-06-16 HISTORY — DX: Gastro-esophageal reflux disease without esophagitis: K21.9

## 2023-06-16 HISTORY — DX: Chest pain, unspecified: R07.9

## 2023-06-16 HISTORY — DX: Dyspnea, unspecified: R06.00

## 2023-06-16 NOTE — Patient Instructions (Addendum)
 Your procedure is scheduled on: March 03/2024 Wednesday Report to the Registration Desk on the 1st floor of the [facility]. To find out your arrival time, please call [phone] between 1PM - 3PM on: Tuesday, March 11 If your arrival time is 6:00 am, do not arrive before that time as the [facility] entrance doors do not open until 6:00 am.  REMEMBER: Instructions that are not followed completely may result in serious medical risk, up to and including death; or upon the discretion of your surgeon and anesthesiologist your surgery may need to be rescheduled.  Do not eat food or drink anything after midnight the night before surgery.  No gum chewing or hard candies.   One week prior to surgery: Stop Anti-inflammatories (NSAIDS) such as Advil, Aleve, Ibuprofen, Motrin, Naproxen, Naprosyn and Aspirin based products such as Excedrin, Goody's Powder, BC Powder. Stop ANY OVER THE COUNTER supplements until after surgery.  You may however, continue to take Tylenol if needed for pain up until the day of surgery.   Continue taking all of your other prescription medications up until the day of surgery.   On the morning of surgery brush your teeth with toothpaste and water, you may rinse your mouth with mouthwash if you wish. Do not swallow any toothpaste or mouthwash.  Use CHG Soap or wipes as directed on instruction sheet.-provided for you   Do not wear jewelry, make-up, hairpins, clips or nail polish.  For welded (permanent) jewelry: bracelets, anklets, waist bands, etc.  Please have this removed prior to surgery.  If it is not removed, there is a chance that hospital personnel will need to cut it off on the day of surgery.  Do not wear lotions, powders, or perfumes.   Do not shave body hair from the neck down 48 hours before surgery.  Contact lenses, hearing aids and dentures may not be worn into surgery.  Do not bring valuables to the hospital. [facility] is not responsible for  any missing/lost belongings or valuables.    Notify your doctor if there is any change in your medical condition (cold, fever, infection).  Wear comfortable clothing (specific to your surgery type) to the hospital.  After surgery, you can help prevent lung complications by doing breathing exercises.  Take deep breaths and cough every 1-2 hours. Your doctor may order a device called an Incentive Spirometer to help you take deep breaths.    If you are being discharged the day of surgery, you will not be allowed to drive home. You will need a responsible individual to drive you home and stay with you for 24 hours after surgery.    Please call the Pre-admissions Testing Dept. at [phone] if you have any questions about these instructions.  Surgery Visitation Policy:  Patients having surgery or a procedure may have two visitors.  Children under the age of 52 must have an adult with them who is not the patient.  Temporary Visitor Restrictions Due to increasing cases of flu, RSV and COVID-19: Children ages 57 and under will not be able to visit patients in [facility] hospitals under most circumstances.      Preparing for Surgery with CHLORHEXIDINE GLUCONATE (CHG) Soap  Chlorhexidine Gluconate (CHG) Soap  o An antiseptic cleaner that kills germs and bonds with the skin to continue killing germs even after washing  o Used for showering the night before surgery and morning of surgery  Before surgery, you can play an important role by reducing  the number of germs on your skin.  CHG (Chlorhexidine gluconate) soap is an antiseptic cleanser which kills germs and bonds with the skin to continue killing germs even after washing.  Please do not use if you have an allergy to CHG or antibacterial soaps. If your skin becomes reddened/irritated stop using the CHG.  1. Shower the NIGHT BEFORE SURGERY and the MORNING OF SURGERY with CHG soap.  2. If you choose to wash your hair, wash  your hair first as usual with your normal shampoo.  3. After shampooing, rinse your hair and body thoroughly to remove the shampoo.  4. Use CHG as you would any other liquid soap. You can apply CHG directly to the skin and wash gently with a scrungie or a clean washcloth.  5. Apply the CHG soap to your body only from the neck down. Do not use on open wounds or open sores. Avoid contact with your eyes, ears, mouth, and genitals (private parts). Wash face and genitals (private parts) with your normal soap.  6. Wash thoroughly, paying special attention to the area where your surgery will be performed.  7. Thoroughly rinse your body with warm water.  8. Do not shower/wash with your normal soap after using and rinsing off the CHG soap.  9. Pat yourself dry with a clean towel.  10. Wear clean pajamas to bed the night before surgery.  12. Place clean sheets on your bed the night of your first shower and do not sleep with pets.  13. Shower again with the CHG soap on the day of surgery prior to arriving at the hospital.  14. Do not apply any deodorants/lotions/powders.  15. Please wear clean clothes to the hospital.

## 2023-06-20 ENCOUNTER — Ambulatory Visit
Admission: RE | Admit: 2023-06-20 | Discharge: 2023-06-20 | Disposition: A | Discharge status: Home / Self Care | Source: Ambulatory Visit | Attending: General Surgery | Admitting: General Surgery

## 2023-06-20 ENCOUNTER — Encounter: Payer: Self-pay | Admitting: Urgent Care

## 2023-06-20 ENCOUNTER — Encounter
Admission: RE | Admit: 2023-06-20 | Discharge: 2023-06-20 | Disposition: A | Discharge status: Home / Self Care | Source: Ambulatory Visit | Attending: General Surgery

## 2023-06-20 DIAGNOSIS — N649 Disorder of breast, unspecified: Secondary | ICD-10-CM | POA: Diagnosis not present

## 2023-06-20 DIAGNOSIS — R079 Chest pain, unspecified: Secondary | ICD-10-CM

## 2023-06-20 DIAGNOSIS — Z01812 Encounter for preprocedural laboratory examination: Secondary | ICD-10-CM | POA: Insufficient documentation

## 2023-06-20 DIAGNOSIS — N6489 Other specified disorders of breast: Secondary | ICD-10-CM | POA: Diagnosis not present

## 2023-06-20 DIAGNOSIS — R928 Other abnormal and inconclusive findings on diagnostic imaging of breast: Secondary | ICD-10-CM | POA: Diagnosis not present

## 2023-06-20 LAB — CBC
HCT: 43 % (ref 36.0–46.0)
Hemoglobin: 14.6 g/dL (ref 12.0–15.0)
MCH: 32.1 pg (ref 26.0–34.0)
MCHC: 34 g/dL (ref 30.0–36.0)
MCV: 94.5 fL (ref 80.0–100.0)
Platelets: 243 10*3/uL (ref 150–400)
RBC: 4.55 MIL/uL (ref 3.87–5.11)
RDW: 12.4 % (ref 11.5–15.5)
WBC: 5.8 10*3/uL (ref 4.0–10.5)
nRBC: 0 % (ref 0.0–0.2)

## 2023-06-20 MED ORDER — CEFAZOLIN SODIUM-DEXTROSE 2-4 GM/100ML-% IV SOLN
2.0000 g | INTRAVENOUS | Status: AC
  Administered 2023-06-21: 2 g via INTRAVENOUS

## 2023-06-20 MED ORDER — ORAL CARE MOUTH RINSE: 15.0000 mL | Freq: Once | OROMUCOSAL | Status: AC

## 2023-06-20 MED ORDER — CHLORHEXIDINE GLUCONATE 0.12 % MT SOLN
15.0000 mL | Freq: Once | OROMUCOSAL | Status: AC
  Administered 2023-06-21: 15 mL via OROMUCOSAL

## 2023-06-20 MED ORDER — LIDOCAINE HCL 1 % IJ SOLN
10.0000 mL | Freq: Once | INTRAMUSCULAR | Status: AC
Start: 2023-06-20 — End: 2023-06-20
  Administered 2023-06-20: 10 mL
  Filled 2023-06-20: qty 10

## 2023-06-20 MED ORDER — LACTATED RINGERS IV SOLN: INTRAVENOUS | Status: DC

## 2023-06-21 ENCOUNTER — Ambulatory Visit: Payer: Self-pay | Admitting: Urgent Care

## 2023-06-21 ENCOUNTER — Ambulatory Visit
Admission: RE | Admit: 2023-06-21 | Discharge: 2023-06-21 | Disposition: A | Discharge status: Home / Self Care | Source: Ambulatory Visit | Attending: General Surgery | Admitting: General Surgery

## 2023-06-21 ENCOUNTER — Ambulatory Visit
Admission: RE | Admit: 2023-06-21 | Discharge: 2023-06-21 | Disposition: A | Discharge status: Home / Self Care | Attending: General Surgery | Admitting: General Surgery

## 2023-06-21 ENCOUNTER — Ambulatory Visit: Admitting: Anesthesiology

## 2023-06-21 ENCOUNTER — Encounter
Admission: RE | Disposition: A | Discharge status: Home / Self Care | Payer: Self-pay | Source: Home / Self Care | Attending: General Surgery

## 2023-06-21 ENCOUNTER — Other Ambulatory Visit: Payer: Self-pay

## 2023-06-21 ENCOUNTER — Encounter: Payer: Self-pay | Admitting: General Surgery

## 2023-06-21 DIAGNOSIS — N6489 Other specified disorders of breast: Secondary | ICD-10-CM | POA: Insufficient documentation

## 2023-06-21 DIAGNOSIS — N6042 Mammary duct ectasia of left breast: Secondary | ICD-10-CM | POA: Insufficient documentation

## 2023-06-21 DIAGNOSIS — N649 Disorder of breast, unspecified: Secondary | ICD-10-CM | POA: Diagnosis not present

## 2023-06-21 DIAGNOSIS — R079 Chest pain, unspecified: Secondary | ICD-10-CM | POA: Diagnosis not present

## 2023-06-21 DIAGNOSIS — Z01812 Encounter for preprocedural laboratory examination: Secondary | ICD-10-CM | POA: Diagnosis not present

## 2023-06-21 DIAGNOSIS — Z803 Family history of malignant neoplasm of breast: Secondary | ICD-10-CM | POA: Diagnosis not present

## 2023-06-21 DIAGNOSIS — R928 Other abnormal and inconclusive findings on diagnostic imaging of breast: Secondary | ICD-10-CM | POA: Diagnosis not present

## 2023-06-21 DIAGNOSIS — Z5986 Financial insecurity: Secondary | ICD-10-CM | POA: Diagnosis not present

## 2023-06-21 DIAGNOSIS — Z920 Personal history of contraception: Secondary | ICD-10-CM | POA: Insufficient documentation

## 2023-06-21 HISTORY — PX: BREAST LUMPECTOMY WITH RADIO FREQUENCY LOCALIZER: SHX6897

## 2023-06-21 SURGERY — BREAST LUMPECTOMY WITH RADIO FREQUENCY LOCALIZER
Anesthesia: General | Site: Breast | Laterality: Left

## 2023-06-21 MED ORDER — SEVOFLURANE IN SOLN
RESPIRATORY_TRACT | Status: AC
  Filled 2023-06-21: qty 250

## 2023-06-21 MED ORDER — LIDOCAINE HCL (CARDIAC) PF 100 MG/5ML IV SOSY
PREFILLED_SYRINGE | INTRAVENOUS | Status: DC | PRN
  Administered 2023-06-21: 60 mg via INTRAVENOUS

## 2023-06-21 MED ORDER — CEFAZOLIN SODIUM-DEXTROSE 2-4 GM/100ML-% IV SOLN
INTRAVENOUS | Status: AC
  Filled 2023-06-21: qty 100

## 2023-06-21 MED ORDER — LIDOCAINE HCL (PF) 2 % IJ SOLN
INTRAMUSCULAR | Status: AC
  Filled 2023-06-21: qty 5

## 2023-06-21 MED ORDER — GLYCOPYRROLATE 0.2 MG/ML IJ SOLN
INTRAMUSCULAR | Status: DC | PRN
  Administered 2023-06-21: .2 mg via INTRAVENOUS

## 2023-06-21 MED ORDER — MIDAZOLAM HCL 2 MG/2ML IJ SOLN
INTRAMUSCULAR | Status: DC | PRN
  Administered 2023-06-21: 1 mg via INTRAVENOUS

## 2023-06-21 MED ORDER — OXYCODONE HCL 5 MG/5ML PO SOLN: 5.0000 mg | Freq: Once | ORAL | Status: DC | PRN

## 2023-06-21 MED ORDER — CHLORHEXIDINE GLUCONATE 0.12 % MT SOLN
OROMUCOSAL | Status: AC
  Filled 2023-06-21: qty 15

## 2023-06-21 MED ORDER — DROPERIDOL 2.5 MG/ML IJ SOLN: 0.6250 mg | Freq: Once | INTRAMUSCULAR | Status: DC | PRN

## 2023-06-21 MED ORDER — DEXAMETHASONE SODIUM PHOSPHATE 10 MG/ML IJ SOLN
INTRAMUSCULAR | Status: AC
  Filled 2023-06-21: qty 1

## 2023-06-21 MED ORDER — STERILE WATER FOR IRRIGATION IR SOLN
Status: DC | PRN
  Administered 2023-06-21: 500 mL

## 2023-06-21 MED ORDER — FENTANYL CITRATE (PF) 100 MCG/2ML IJ SOLN: 25.0000 ug | INTRAMUSCULAR | Status: DC | PRN

## 2023-06-21 MED ORDER — HEMOSTATIC AGENTS (NO CHARGE) OPTIME
TOPICAL | Status: DC | PRN
  Administered 2023-06-21: 1 via TOPICAL

## 2023-06-21 MED ORDER — ONDANSETRON HCL 4 MG/2ML IJ SOLN
INTRAMUSCULAR | Status: AC
  Filled 2023-06-21: qty 2

## 2023-06-21 MED ORDER — FENTANYL CITRATE (PF) 100 MCG/2ML IJ SOLN
INTRAMUSCULAR | Status: DC | PRN
  Administered 2023-06-21 (×2): 50 ug via INTRAVENOUS

## 2023-06-21 MED ORDER — ONDANSETRON HCL 4 MG/2ML IJ SOLN
INTRAMUSCULAR | Status: DC | PRN
Start: 2023-06-21 — End: 2023-06-21
  Administered 2023-06-21: 4 mg via INTRAVENOUS

## 2023-06-21 MED ORDER — TRAMADOL HCL 50 MG PO TABS: 50.0000 mg | ORAL_TABLET | Freq: Four times a day (QID) | ORAL | 0 refills | Status: AC | PRN

## 2023-06-21 MED ORDER — OXYCODONE HCL 5 MG PO TABS: 5.0000 mg | ORAL_TABLET | Freq: Once | ORAL | Status: DC | PRN

## 2023-06-21 MED ORDER — PROPOFOL 10 MG/ML IV BOLUS
INTRAVENOUS | Status: DC | PRN
  Administered 2023-06-21: 150 mg via INTRAVENOUS
  Administered 2023-06-21: 30 mg via INTRAVENOUS

## 2023-06-21 MED ORDER — FENTANYL CITRATE (PF) 100 MCG/2ML IJ SOLN
INTRAMUSCULAR | Status: AC
  Filled 2023-06-21: qty 2

## 2023-06-21 MED ORDER — PROPOFOL 10 MG/ML IV BOLUS
INTRAVENOUS | Status: AC
  Filled 2023-06-21: qty 40

## 2023-06-21 MED ORDER — EPHEDRINE 5 MG/ML INJ
INTRAVENOUS | Status: AC
  Filled 2023-06-21: qty 5

## 2023-06-21 MED ORDER — ACETAMINOPHEN 10 MG/ML IV SOLN: 1000.0000 mg | Freq: Once | INTRAVENOUS | Status: DC | PRN

## 2023-06-21 MED ORDER — OXYCODONE HCL 5 MG PO TABS
5.0000 mg | ORAL_TABLET | Freq: Once | ORAL | Status: AC
  Administered 2023-06-21: 5 mg via ORAL

## 2023-06-21 MED ORDER — EPHEDRINE SULFATE-NACL 50-0.9 MG/10ML-% IV SOSY
PREFILLED_SYRINGE | INTRAVENOUS | Status: DC | PRN
  Administered 2023-06-21: 5 mg via INTRAVENOUS
  Administered 2023-06-21: 10 mg via INTRAVENOUS
  Administered 2023-06-21: 5 mg via INTRAVENOUS
  Administered 2023-06-21: 10 mg via INTRAVENOUS

## 2023-06-21 MED ORDER — OXYCODONE HCL 5 MG PO TABS
ORAL_TABLET | ORAL | Status: AC
  Filled 2023-06-21: qty 1

## 2023-06-21 MED ORDER — BUPIVACAINE-EPINEPHRINE (PF) 0.5% -1:200000 IJ SOLN
INTRAMUSCULAR | Status: AC
  Filled 2023-06-21: qty 30

## 2023-06-21 MED ORDER — BUPIVACAINE-EPINEPHRINE (PF) 0.5% -1:200000 IJ SOLN
INTRAMUSCULAR | Status: DC | PRN
  Administered 2023-06-21: 20 mL

## 2023-06-21 MED ORDER — DEXAMETHASONE SODIUM PHOSPHATE 10 MG/ML IJ SOLN
INTRAMUSCULAR | Status: DC | PRN
  Administered 2023-06-21: 10 mg via INTRAVENOUS

## 2023-06-21 MED ORDER — MIDAZOLAM HCL 2 MG/2ML IJ SOLN
INTRAMUSCULAR | Status: AC
  Filled 2023-06-21: qty 2

## 2023-06-21 SURGICAL SUPPLY — 32 items
BLADE SURG 15 STRL LF DISP TIS (BLADE) ×1 IMPLANT
CHLORAPREP W/TINT 26 (MISCELLANEOUS) IMPLANT
DERMABOND ADVANCED .7 DNX12 (GAUZE/BANDAGES/DRESSINGS) ×1 IMPLANT
DEVICE DUBIN SPECIMEN MAMMOGRA (MISCELLANEOUS) ×1 IMPLANT
DRAPE LAPAROTOMY TRNSV 106X77 (MISCELLANEOUS) ×1 IMPLANT
ELECT REM PT RETURN 9FT ADLT (ELECTROSURGICAL) ×1 IMPLANT
ELECTRODE REM PT RTRN 9FT ADLT (ELECTROSURGICAL) ×1 IMPLANT
GLOVE BIO SURGEON STRL SZ 6.5 (GLOVE) ×1 IMPLANT
GLOVE BIOGEL PI IND STRL 6.5 (GLOVE) ×1 IMPLANT
GOWN STRL REUS W/ TWL LRG LVL3 (GOWN DISPOSABLE) ×3 IMPLANT
KIT MARKER MARGIN INK (KITS) IMPLANT
KIT TURNOVER KIT A (KITS) ×1 IMPLANT
LABEL OR SOLS (LABEL) ×1 IMPLANT
MANIFOLD NEPTUNE II (INSTRUMENTS) ×1 IMPLANT
MARKER MARGIN CORRECT CLIP (MARKER) IMPLANT
NDL HYPO 22X1.5 SAFETY MO (MISCELLANEOUS) ×1 IMPLANT
NEEDLE HYPO 22X1.5 SAFETY MO (MISCELLANEOUS) ×1 IMPLANT
PACK BASIN MINOR ARMC (MISCELLANEOUS) ×1 IMPLANT
POWDER SURGICEL 3.0 GRAM (HEMOSTASIS) IMPLANT
RETRACTOR RING XSMALL (MISCELLANEOUS) IMPLANT
RTRCTR WOUND ALEXIS 13CM XS SH (MISCELLANEOUS) ×1 IMPLANT
SHEATH BREAST BIOPSY SKIN MKR (SHEATH) ×1 IMPLANT
SUT MNCRL 4-0 27 PS-2 XMFL (SUTURE) ×1 IMPLANT
SUT SILK 2 0 SH (SUTURE) IMPLANT
SUT VIC AB 3-0 SH 27X BRD (SUTURE) ×1 IMPLANT
SUTURE MNCRL 4-0 27XMF (SUTURE) ×1 IMPLANT
SYR 10ML LL (SYRINGE) ×1 IMPLANT
SYR BULB IRRIG 60ML STRL (SYRINGE) ×1 IMPLANT
TRAP FLUID SMOKE EVACUATOR (MISCELLANEOUS) ×1 IMPLANT
TRAP NEPTUNE SPECIMEN COLLECT (MISCELLANEOUS) ×1 IMPLANT
WATER STERILE IRR 1000ML POUR (IV SOLUTION) ×1 IMPLANT
WATER STERILE IRR 500ML POUR (IV SOLUTION) ×1 IMPLANT

## 2023-06-21 NOTE — Interval H&P Note (Signed)
 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  The various methods of treatment have been discussed with the patient and family. After consideration of risks, benefits and other options for treatment, the patient has consented to  Procedure(s): BREAST LUMPECTOMY WITH RADIO FREQUENCY LOCALIZER (Left) as a surgical intervention.  The patient's history has been reviewed, patient examined, no change in status, stable for surgery.  I have reviewed the patient's chart and labs.  Questions were answered to the patient's satisfaction.     Carolan Shiver

## 2023-06-21 NOTE — Anesthesia Procedure Notes (Signed)
 Procedure Name: LMA Insertion Date/Time: 06/21/2023 12:28 PM  Performed by: Darrell Jewel I, CRNAPre-anesthesia Checklist: Patient identified, Patient being monitored, Timeout performed, Emergency Drugs available and Suction available Patient Re-evaluated:Patient Re-evaluated prior to induction Oxygen Delivery Method: Circle system utilized Preoxygenation: Pre-oxygenation with 100% oxygen Induction Type: IV induction Ventilation: Mask ventilation without difficulty LMA: LMA inserted LMA Size: 4.0 Tube type: Oral Number of attempts: 1 Placement Confirmation: positive ETCO2 and breath sounds checked- equal and bilateral Tube secured with: Tape Dental Injury: Teeth and Oropharynx as per pre-operative assessment

## 2023-06-21 NOTE — Discharge Instructions (Addendum)

## 2023-06-21 NOTE — Transfer of Care (Signed)
 Immediate Anesthesia Transfer of Care Note  Patient: Cassandra Carrillo  Procedure(s) Performed: BREAST LUMPECTOMY WITH RADIO FREQUENCY LOCALIZER (Left: Breast)  Patient Location: PACU  Anesthesia Type:General  Level of Consciousness: awake and alert   Airway & Oxygen Therapy: Patient Spontanous Breathing and Patient connected to face mask oxygen  Post-op Assessment: Report given to RN and Post -op Vital signs reviewed and stable  Post vital signs: stable  Last Vitals:  Vitals Value Taken Time  BP    Temp    Pulse 75 06/21/23 1347  Resp 17 06/21/23 1347  SpO2 100 % 06/21/23 1347  Vitals shown include unfiled device data.  Last Pain:  Vitals:   06/21/23 1011  TempSrc: Temporal  PainSc: 0-No pain         Complications: No notable events documented.

## 2023-06-21 NOTE — Op Note (Signed)
 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  Postoperative diagnosis: Same.   Procedure: Left radiofrequency tag-localized excisional biopsy.                      Anesthesia: GETA  Surgeon: Dr. Hazle Quant  Wound Classification: Clean  Indications: Patient is a 58 y.o. female with a nonpalpable left breast mass noted on mammography with core biopsy demonstrating complex sclerosing lesion requires radiofrequency tag-localized excisional biopsy to rule out malignancy.   Findings: 1. Specimen mammography shows marker and tag on specimen 2. No other palpable mass or lymph node identified.   Description of procedure: Preoperative radiofrequency tag localization was performed by radiology. The patient was taken to the operating room and placed supine on the operating table, and after general anesthesia the left chest was prepped and draped in the usual sterile fashion. A time-out was completed verifying correct patient, procedure, site, positioning, and implant(s) and/or special equipment prior to beginning this procedure.  By comparing the localization studies and interrogation with Localizer device, the probable trajectory and location of the mass was visualized. A circumareolar skin incision was planned in such a way as to minimize the amount of dissection to reach the mass.  The skin incision was made. Flaps were raised and the location of the tag was confirmed with Localizer device confirmed. A 2-0 silk figure-of-eight stay suture was placed and used for retraction. Dissection was then taken down circumferentially, taking care to include the entire localizing tag and a wide margin of grossly normal tissue. The specimen and entire localizing tag were removed. The specimen was oriented and sent to radiology with the localization studies. Confirmation was received that the entire target lesion had been resected. The wound was irrigated. Hemostasis was checked. The  wound was closed with interrupted sutures of 3-0 Vicryl and a subcuticular suture of Monocryl 3-0. No attempt was made to close the dead space.   Specimen: Left excisional biopsy                      Complications: None  Estimated Blood Loss: 5 mL

## 2023-06-21 NOTE — Anesthesia Preprocedure Evaluation (Signed)
 Anesthesia Evaluation  Patient identified by MRN, date of birth, ID band Patient awake    Reviewed: Allergy & Precautions, NPO status , Patient's Chart, lab work & pertinent test results  History of Anesthesia Complications Negative for: history of anesthetic complications  Airway Mallampati: II  TM Distance: >3 FB Neck ROM: Full    Dental  (+) Teeth Intact   Pulmonary asthma , neg sleep apnea, neg COPD, Patient abstained from smoking.Not current smoker   Pulmonary exam normal breath sounds clear to auscultation       Cardiovascular Exercise Tolerance: Good METS(-) hypertension(-) CAD and (-) Past MI negative cardio ROS (-) dysrhythmias  Rhythm:Regular Rate:Normal - Systolic murmurs    Neuro/Psych  PSYCHIATRIC DISORDERS Anxiety Depression    negative neurological ROS     GI/Hepatic ,GERD  Medicated and Controlled,,(+)     (-) substance abuse    Endo/Other  neg diabetes    Renal/GU negative Renal ROS     Musculoskeletal   Abdominal   Peds  Hematology   Anesthesia Other Findings Past Medical History: No date: Anxiety No date: Arthritis     Comment:  fingers No date: Asthma No date: Chest pain No date: Depression No date: Dyspnea No date: GERD (gastroesophageal reflux disease) 2025: Lesion of breast     Comment:  left No date: Pedal edema No date: Vertigo     Comment:  x1 with sinus infection  Reproductive/Obstetrics                             Anesthesia Physical Anesthesia Plan  ASA: 2  Anesthesia Plan: General   Post-op Pain Management:    Induction: Intravenous  PONV Risk Score and Plan: 3 and Ondansetron, Dexamethasone and Midazolam  Airway Management Planned: LMA  Additional Equipment: None  Intra-op Plan:   Post-operative Plan: Extubation in OR  Informed Consent: I have reviewed the patients History and Physical, chart, labs and discussed the procedure  including the risks, benefits and alternatives for the proposed anesthesia with the patient or authorized representative who has indicated his/her understanding and acceptance.     Dental advisory given  Plan Discussed with: CRNA and Surgeon  Anesthesia Plan Comments: (Discussed risks of anesthesia with patient, including PONV, sore throat, lip/dental/eye damage. Rare risks discussed as well, such as cardiorespiratory and neurological sequelae, and allergic reactions. Discussed the role of CRNA in patient's perioperative care. Patient understands. Patient has listed allergy to PCN. - rash as a child Severe blistering skin reaction (SJS/TEN)? no Liver or kidney injury caused by PCN? no Hemolytic anemia from PCN? no Drug fever? no Painful swollen joints? no Severe reaction involving inside of mouth, eye, or genital ulcers? no Based on current evidence Elizebeth Koller et al, J Allergy Clin Immunol Pract, 2019), will proceed with cefazolin use: Yes  )       Anesthesia Quick Evaluation

## 2023-06-22 ENCOUNTER — Other Ambulatory Visit: Payer: Self-pay | Admitting: Pathology

## 2023-06-22 ENCOUNTER — Ambulatory Visit
Admission: RE | Admit: 2023-06-22 | Discharge: 2023-06-22 | Disposition: A | Discharge status: Home / Self Care | Source: Ambulatory Visit | Attending: Pathology | Admitting: Pathology

## 2023-06-22 DIAGNOSIS — Z419 Encounter for procedure for purposes other than remedying health state, unspecified: Secondary | ICD-10-CM

## 2023-06-22 NOTE — Anesthesia Postprocedure Evaluation (Signed)
 Anesthesia Post Note  Patient: Cassandra Carrillo  Procedure(s) Performed: BREAST LUMPECTOMY WITH RADIO FREQUENCY LOCALIZER (Left: Breast)  Patient location during evaluation: PACU Anesthesia Type: General Level of consciousness: awake and alert Pain management: pain level controlled Vital Signs Assessment: post-procedure vital signs reviewed and stable Respiratory status: spontaneous breathing, nonlabored ventilation and respiratory function stable Cardiovascular status: blood pressure returned to baseline and stable Postop Assessment: no apparent nausea or vomiting Anesthetic complications: no   No notable events documented.   Last Vitals:  Vitals:   06/21/23 1415 06/21/23 1430  BP: 116/73 118/70  Pulse: 70 72  Resp: 14 14  Temp: 36.4 C 36.4 C  SpO2: 98% 100%    Last Pain:  Vitals:   06/21/23 1430  TempSrc: Temporal  PainSc: 3                  Foye Deer

## 2023-06-23 ENCOUNTER — Encounter: Payer: Self-pay | Admitting: General Surgery

## 2023-06-23 LAB — SURGICAL PATHOLOGY

## 2023-10-05 DIAGNOSIS — R928 Other abnormal and inconclusive findings on diagnostic imaging of breast: Secondary | ICD-10-CM | POA: Diagnosis not present

## 2023-10-05 DIAGNOSIS — Z6832 Body mass index (BMI) 32.0-32.9, adult: Secondary | ICD-10-CM | POA: Diagnosis not present

## 2023-10-05 DIAGNOSIS — Z1331 Encounter for screening for depression: Secondary | ICD-10-CM | POA: Diagnosis not present

## 2023-10-05 DIAGNOSIS — E669 Obesity, unspecified: Secondary | ICD-10-CM | POA: Diagnosis not present

## 2023-10-05 DIAGNOSIS — R600 Localized edema: Secondary | ICD-10-CM | POA: Diagnosis not present

## 2023-10-05 DIAGNOSIS — Z0000 Encounter for general adult medical examination without abnormal findings: Secondary | ICD-10-CM | POA: Diagnosis not present

## 2023-10-05 DIAGNOSIS — R7989 Other specified abnormal findings of blood chemistry: Secondary | ICD-10-CM | POA: Diagnosis not present

## 2023-11-13 ENCOUNTER — Other Ambulatory Visit: Payer: Self-pay | Admitting: Certified Nurse Midwife

## 2023-11-13 DIAGNOSIS — R87615 Unsatisfactory cytologic smear of cervix: Secondary | ICD-10-CM | POA: Diagnosis not present

## 2023-11-13 DIAGNOSIS — Z124 Encounter for screening for malignant neoplasm of cervix: Secondary | ICD-10-CM | POA: Diagnosis not present

## 2023-11-13 DIAGNOSIS — Z1231 Encounter for screening mammogram for malignant neoplasm of breast: Secondary | ICD-10-CM

## 2023-11-13 DIAGNOSIS — Z1331 Encounter for screening for depression: Secondary | ICD-10-CM | POA: Diagnosis not present

## 2023-11-13 DIAGNOSIS — Z9189 Other specified personal risk factors, not elsewhere classified: Secondary | ICD-10-CM | POA: Diagnosis not present

## 2023-12-06 DIAGNOSIS — M9903 Segmental and somatic dysfunction of lumbar region: Secondary | ICD-10-CM | POA: Diagnosis not present

## 2023-12-06 DIAGNOSIS — M9902 Segmental and somatic dysfunction of thoracic region: Secondary | ICD-10-CM | POA: Diagnosis not present

## 2023-12-06 DIAGNOSIS — M5136 Other intervertebral disc degeneration, lumbar region with discogenic back pain only: Secondary | ICD-10-CM | POA: Diagnosis not present

## 2023-12-06 DIAGNOSIS — M5416 Radiculopathy, lumbar region: Secondary | ICD-10-CM | POA: Diagnosis not present

## 2023-12-30 ENCOUNTER — Emergency Department
Admission: EM | Admit: 2023-12-30 | Discharge: 2023-12-30 | Disposition: A | Discharge status: Home / Self Care | Attending: Emergency Medicine | Admitting: Emergency Medicine

## 2023-12-30 ENCOUNTER — Other Ambulatory Visit: Payer: Self-pay

## 2023-12-30 DIAGNOSIS — R040 Epistaxis: Secondary | ICD-10-CM | POA: Insufficient documentation

## 2023-12-30 MED ORDER — OXYMETAZOLINE HCL 0.05 % NA SOLN: 2.0000 | Freq: Two times a day (BID) | NASAL | 0 refills | Status: AC

## 2023-12-30 NOTE — ED Triage Notes (Signed)
 Pt to ed from home via POV for nose bleed. Pt advised she blew her nose and then it started to bleed and she was also coughing up blood. Pt is caox4, in no acute distress and no active bleeding in triage. Blood has dried in her nose.

## 2023-12-30 NOTE — ED Provider Notes (Signed)
 [facility] Provider Note    Event Date/Time   First MD Initiated Contact with Patient 12/30/23 1728     (approximate)   History   Epistaxis    HPI  Cassandra Carrillo is a 58 y.o. female    with a past medical history of L second tube dysfunction, septoplasty who presents to the ED complaining of  epistaxis . According to the patient, she went to the bathroom and started having left epistaxis.  First episode.  Patient has history of allergies, she is using nasal steroids.  Patient is here with her husband     There are no active problems to display for this patient.     ROS: Patient currently denies any vision changes, tinnitus, difficulty speaking, facial droop, sore throat, chest pain, shortness of breath, abdominal pain, nausea/vomiting/diarrhea, dysuria, or weakness/numbness/paresthesias in any extremity   Physical Exam   Triage Vital Signs: ED Triage Vitals  Encounter Vitals Group     BP 12/30/23 1700 (!) 144/76     Girls Systolic BP Percentile --      Girls Diastolic BP Percentile --      Boys Systolic BP Percentile --      Boys Diastolic BP Percentile --      Pulse Rate 12/30/23 1700 100     Resp 12/30/23 1700 20     Temp 12/30/23 1700 98.2 F (36.8 C)     Temp Source 12/30/23 1700 Oral     SpO2 12/30/23 1700 95 %     Weight --      Height 12/30/23 1701 5' 4 (1.626 m)     Head Circumference --      Peak Flow --      Pain Score 12/30/23 1701 0     Pain Loc --      Pain Education --      Exclude from Growth Chart --     Most recent vital signs: Vitals:   12/30/23 1700  BP: (!) 144/76  Pulse: 100  Resp: 20  Temp: 98.2 F (36.8 C)  SpO2: 95%     Physical Exam Vitals and nursing note reviewed.  Patient was hypertensive in triage  Constitutional:      General: Awake and alert. No acute distress.    Appearance: Normal appearance. The patient is normal weight.      Able to speak in complete sentences without cough or  dyspnea  HENT:     Head: Normocephalic and atraumatic.     Mouth: Mucous membranes are moist.  Eyes:     General: PERRL. Normal EOMs          Conjunctiva/sclera: Conjunctivae normal.  Nose No congestion/rhinorrhea Left nostril: Presence of dried blood, no active bleeding. Under speculoscopy there is a presence of a varicose vein in the anterior for of the nasal bridge.   CV:                  Good peripheral perfusion.  Regular rate and rhythm  Resp:               Normal effort.  Equal breath sounds bilaterally.  Abd:                 No distention.  Soft, nontender.  No rebound or guarding.  Musculoskeletal:        General: No swelling. Normal range of motion.  Skin:    General: Skin is warm and dry.  Capillary Refill: Capillary refill takes less than 2 seconds.     Findings: No rash.  Neurological:     Mental Status: The patient is awake and alert. MAE spontaneously. No gross focal neurologic deficits are appreciated.  Psychiatric Mood and affect are normal. Speech and behavior are normal.  ED Results / Procedures / Treatments   Labs (all labs ordered are listed, but only abnormal results are displayed) Labs Reviewed - No data to display   PROCEDURES:  Critical Care performed:   Cauterization  Date/Time: 12/30/2023 6:13 PM  Performed by: Janit Kast, PA-C Authorized by: Janit Kast, PA-C  Consent: Verbal consent obtained Risks and benefits: risks, benefits and alternatives were discussed Consent given by: patient Patient understanding: patient states understanding of the procedure being performed Patient identity confirmed: verbally with patient Local anesthesia used: no  Anesthesia: Local anesthesia used: no  Sedation: Patient sedated: no  Patient tolerance: patient tolerated the procedure well with no immediate complications Comments: Catheterization with silver nitrate in the left nostril      MEDICATIONS ORDERED IN ED: Medications - No  data to display    IMPRESSION / MDM / ASSESSMENT AND PLAN / ED COURSE  I reviewed the triage vital signs and the nursing notes.  Differential diagnosis includes, but is not limited to, epistaxis, septal hematoma, foreign body  Patient's presentation is most consistent with acute, uncomplicated illness.    Cassandra Carrillo is a 58 y.o., female presents today with history of left nostril epistaxis, first episode.  Patient has history of allergies, she is using nasal steroids.  On physical exam presence of dried blood in the left nostril, no active bleeding.  Evidence of varicose vein in the anterior upper of the nasal bridge.  I was able to cauterize deep vein. Patient's diagnosis is consistent with left nostril epistaxis. I I did not order any imaging or labs, physical exam was reassuring I did review the patient's allergies and medications.The patient is in stable and satisfactory condition for discharge home  Patient will be discharged home with prescriptions for Afrin.  I did advise patient to apply Afrin in a cottonball and applied that in the left nostril in case of new episode of epistaxis.  I advised patient not to blow her nose. Patient is to follow up with ENT as needed or otherwise directed. Patient is given ED precautions to return to the ED for any worsening or new symptoms. Discussed plan of care with patient, answered all of patient's questions, Patient agreeable to plan of care. Advised patient to take medications according to the instructions on the label. Discussed possible side effects of new medications. Patient verbalized understanding.  FINAL CLINICAL IMPRESSION(S) / ED DIAGNOSES   Final diagnoses:  Left-sided epistaxis     Rx / DC Orders   ED Discharge Orders          Ordered    oxymetazoline  (AFRIN) 0.05 % nasal spray  2 times daily        12/30/23 1816             Note:  This document was prepared using Dragon voice recognition software and may include  unintentional dictation errors.   Janit Kast, PA-C 12/30/23 1818    Bradler, Evan K, MD 12/30/23 [phone]

## 2023-12-30 NOTE — Discharge Instructions (Addendum)
 You have been diagnosed with left-sided epistaxis.  Please do not blow your nose.  In case of new episodes of nasal bleeding please apply Afrin drops in a cottonball and apply the cottonball in your left nostril.  Please call Dr. Blair on Monday and make an appointment for a follow-up.  Please come back to ED or go to your PCP if you have new symptoms or symptoms worsen.  It was a pleasure to help you today.

## 2024-01-08 DIAGNOSIS — R040 Epistaxis: Secondary | ICD-10-CM | POA: Diagnosis not present

## 2024-01-18 DIAGNOSIS — R600 Localized edema: Secondary | ICD-10-CM | POA: Diagnosis not present

## 2024-01-18 DIAGNOSIS — I340 Nonrheumatic mitral (valve) insufficiency: Secondary | ICD-10-CM | POA: Diagnosis not present

## 2024-01-18 DIAGNOSIS — F411 Generalized anxiety disorder: Secondary | ICD-10-CM | POA: Diagnosis not present

## 2024-01-18 DIAGNOSIS — Z23 Encounter for immunization: Secondary | ICD-10-CM | POA: Diagnosis not present

## 2024-02-01 DIAGNOSIS — M9902 Segmental and somatic dysfunction of thoracic region: Secondary | ICD-10-CM | POA: Diagnosis not present

## 2024-02-01 DIAGNOSIS — M9903 Segmental and somatic dysfunction of lumbar region: Secondary | ICD-10-CM | POA: Diagnosis not present

## 2024-02-01 DIAGNOSIS — M5136 Other intervertebral disc degeneration, lumbar region with discogenic back pain only: Secondary | ICD-10-CM | POA: Diagnosis not present

## 2024-02-01 DIAGNOSIS — M5416 Radiculopathy, lumbar region: Secondary | ICD-10-CM | POA: Diagnosis not present

## 2024-02-26 DIAGNOSIS — H5203 Hypermetropia, bilateral: Secondary | ICD-10-CM | POA: Diagnosis not present
